# Patient Record
Sex: Female | Born: 1985 | Race: Black or African American | Hispanic: No | Marital: Single
Health system: Midwestern US, Community
[De-identification: ages and names within clinical notes are randomized; demographics above are authoritative.]

## PROBLEM LIST (undated history)

## (undated) DIAGNOSIS — Z6841 Body Mass Index (BMI) 40.0 and over, adult: Secondary | ICD-10-CM

---

## 2006-11-15 HISTORY — PX: BACK SURGERY: SHX140

## 2013-12-11 ENCOUNTER — Emergency Department (HOSPITAL_COMMUNITY)
Admission: EM | Admit: 2013-12-11 | Discharge: 2013-12-11 | Disposition: A | Discharge status: Home / Self Care | Payer: No Typology Code available for payment source | Attending: Emergency Medicine | Admitting: Emergency Medicine

## 2013-12-11 ENCOUNTER — Encounter (HOSPITAL_COMMUNITY): Payer: Self-pay | Admitting: Emergency Medicine

## 2013-12-11 ENCOUNTER — Emergency Department (HOSPITAL_COMMUNITY): Payer: No Typology Code available for payment source

## 2013-12-11 DIAGNOSIS — Y939 Activity, unspecified: Secondary | ICD-10-CM | POA: Insufficient documentation

## 2013-12-11 DIAGNOSIS — Y92410 Unspecified street and highway as the place of occurrence of the external cause: Secondary | ICD-10-CM | POA: Insufficient documentation

## 2013-12-11 DIAGNOSIS — IMO0002 Reserved for concepts with insufficient information to code with codable children: Secondary | ICD-10-CM | POA: Insufficient documentation

## 2013-12-11 DIAGNOSIS — M545 Low back pain, unspecified: Secondary | ICD-10-CM

## 2013-12-11 DIAGNOSIS — M549 Dorsalgia, unspecified: Secondary | ICD-10-CM

## 2013-12-11 DIAGNOSIS — Z791 Long term (current) use of non-steroidal anti-inflammatories (NSAID): Secondary | ICD-10-CM | POA: Insufficient documentation

## 2013-12-11 LAB — URINALYSIS, ROUTINE W REFLEX MICROSCOPIC
Bilirubin Urine: NEGATIVE
Glucose, UA: NEGATIVE mg/dL
HGB URINE DIPSTICK: NEGATIVE
Ketones, ur: NEGATIVE mg/dL
Leukocytes, UA: NEGATIVE
Nitrite: NEGATIVE
PROTEIN: NEGATIVE mg/dL
Specific Gravity, Urine: 1.023 (ref 1.005–1.030)
UROBILINOGEN UA: 0.2 mg/dL (ref 0.0–1.0)
pH: 7.5 (ref 5.0–8.0)

## 2013-12-11 MED ORDER — HYDROCODONE-ACETAMINOPHEN 5-325 MG PO TABS: 1.0000 | ORAL_TABLET | ORAL | Status: AC | PRN

## 2013-12-11 MED ORDER — IBUPROFEN 400 MG PO TABS
800.0000 mg | ORAL_TABLET | Freq: Once | ORAL | Status: AC
  Administered 2013-12-11: 800 mg via ORAL
  Filled 2013-12-11: qty 2

## 2013-12-11 MED ORDER — KETOROLAC TROMETHAMINE 30 MG/ML IJ SOLN
30.0000 mg | Freq: Once | INTRAMUSCULAR | Status: AC
  Administered 2013-12-11: 30 mg via INTRAMUSCULAR

## 2013-12-11 MED ORDER — KETOROLAC TROMETHAMINE 30 MG/ML IJ SOLN
30.0000 mg | Freq: Once | INTRAMUSCULAR | Status: DC
  Filled 2013-12-11: qty 1

## 2013-12-11 MED ORDER — CYCLOBENZAPRINE HCL 10 MG PO TABS: 10.0000 mg | ORAL_TABLET | Freq: Every day | ORAL | Status: AC

## 2013-12-11 NOTE — Discharge Instructions (Signed)
Call for a follow up appointment with a Family or Primary Care Provider.  Call Dr. Su LeyKoen for further evaluation of your back pain. Return if Symptoms worsen.   Take medication as prescribed.
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
## 2013-12-14 NOTE — ED Provider Notes (Signed)
Medical screening examination/treatment/procedure(s) were performed by non-physician practitioner and as supervising physician I was immediately available for consultation/collaboration.  Flint MelterElliott L Fabian Walder, MD 12/14/13 [id number]
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
## 2019-07-14 ENCOUNTER — Inpatient Hospital Stay
Admit: 2019-07-14 | Discharge: 2019-07-14 | Disposition: A | Discharge status: Home / Self Care | Payer: PRIVATE HEALTH INSURANCE | Attending: Emergency Medicine

## 2019-07-14 DIAGNOSIS — J029 Acute pharyngitis, unspecified: Secondary | ICD-10-CM

## 2019-07-14 MED ORDER — CLINDAMYCIN 150 MG CAP
150 mg | ORAL_CAPSULE | Freq: Four times a day (QID) | ORAL | 0 refills | Status: DC
Start: 2019-07-14 — End: 2019-07-23

## 2019-07-14 MED ORDER — FLUCONAZOLE 150 MG TAB
150 mg | ORAL_TABLET | Freq: Every day | ORAL | 0 refills | Status: AC
Start: 2019-07-14 — End: 2019-07-15

## 2019-07-14 NOTE — ED Provider Notes (Signed)
33 year old female notes day 4 of sore throat, mild congestion, and left earache.  Patient seen in urgent care on day 1 of symptoms and had negative strep screen performed.  Also underwent COVID???19 testing with result pending.  Patient denies cough, fever, or shortness of breath.  Notes ongoing and worsening pain in the left side of the throat extending into the left ear and left side of the neck.  Pain on swallowing.           History reviewed. No pertinent past medical history.    Past Surgical History:   Procedure Laterality Date   ??? HX BACK SURGERY     ??? HX CESAREAN SECTION           History reviewed. No pertinent family history.    Social History     Socioeconomic History   ??? Marital status: SINGLE     Spouse name: Not on file   ??? Number of children: Not on file   ??? Years of education: Not on file   ??? Highest education level: Not on file   Occupational History   ??? Not on file   Social Needs   ??? Financial resource strain: Not on file   ??? Food insecurity     Worry: Not on file     Inability: Not on file   ??? Transportation needs     Medical: Not on file     Non-medical: Not on file   Tobacco Use   ??? Smoking status: Not on file   Substance and Sexual Activity   ??? Alcohol use: Not on file   ??? Drug use: Not on file   ??? Sexual activity: Not on file   Lifestyle   ??? Physical activity     Days per week: Not on file     Minutes per session: Not on file   ??? Stress: Not on file   Relationships   ??? Social Product manager on phone: Not on file     Gets together: Not on file     Attends religious service: Not on file     Active member of club or organization: Not on file     Attends meetings of clubs or organizations: Not on file     Relationship status: Not on file   ??? Intimate partner violence     Fear of current or ex partner: Not on file     Emotionally abused: Not on file     Physically abused: Not on file     Forced sexual activity: Not on file   Other Topics Concern   ??? Not on file   Social History Narrative   ???  Not on file         ALLERGIES: Latex and Pcn [penicillins]    Review of Systems   Constitutional: Negative for fever.   HENT: Positive for ear pain and sore throat.    Eyes: Negative for discharge.   Respiratory: Negative for cough and shortness of breath.    All other systems reviewed and are negative.      Vitals:    07/14/19 0940   BP: 131/83   Pulse: 87   Resp: 16   Temp: 98.9 ??F (37.2 ??C)   SpO2: 100%   Weight: 112.5 kg (248 lb)   Height: 5' (1.524 m)            Physical Exam  Vitals signs and nursing note reviewed.  Constitutional:       General: She is not in acute distress.     Appearance: She is well-developed.   HENT:      Head: Normocephalic and atraumatic.      Right Ear: Tympanic membrane normal.      Left Ear: Tympanic membrane normal.      Mouth/Throat:      Comments: Small amount of exudate along the left peritonsillar region.  Uvula is symmetric and midline.  Bilateral posterior oropharyngeal erythema present.  Eyes:      General: No scleral icterus.  Neck:      Comments: Tender left submandibular node.  Cardiovascular:      Rate and Rhythm: Normal rate.   Pulmonary:      Effort: Pulmonary effort is normal. No respiratory distress.      Breath sounds: Normal breath sounds.   Lymphadenopathy:      Cervical: Cervical adenopathy present.   Skin:     General: Skin is warm and dry.   Neurological:      Mental Status: She is alert and oriented to person, place, and time.          MDM  Number of Diagnoses or Management Options  Earache on left:   Exudative pharyngitis:   Diagnosis management comments: Impression: Acute pharyngitis worse on the left.  No exam findings of peritonsillar abscess currently.  Patient reports negative strep test.  Has some scant exudate.  Based on age, mono unlikely as primary source.  Will provide coverage for strep in case of possible false negative test as well as other atypicals.  Patient reports penicillin allergy.  Placed on clindamycin.  Diflucan also provided as patient  has history of vaginal yeast infections and response to antibiotics.  Overall appearance is nontoxic.  She has normal oxygen saturation is afebrile here.         Procedures    Diagnosis:   1. Exudative pharyngitis    2. Earache on left      1) Salt water gargles  2) Rest  3) Plenty of fluids  4) Tylenol or Motrin for pain and fever  5) Clindamycin  6) Follow up with your PCP for recheck in 5-7 days if not improved.  7) Probiotics (over the counter) while taking Clindamycin  8) Diflucan x 1 dose should vaginal yeast infection occur    Disposition: home    Follow-up Information     Follow up With Specialties Details Why Contact Info    [facility]  Schedule an appointment as soon as possible for a visit in 1 week As needed, If symptoms persist [address]  [address]  [place] [id number]  [phone]    [facility] Emergency Medicine  If symptoms worsen [address]  [place] [phone]  [phone]          Patient's Medications   Start Taking    CLINDAMYCIN (CLEOCIN) 150 MG CAPSULE    Take 2 Caps by mouth four (4) times daily.    FLUCONAZOLE (DIFLUCAN) 150 MG TABLET    Take 1 Tab by mouth daily for 1 day.   Continue Taking    LEVONORGESTREL (MIRENA) 20 MCG/24 HOURS (5 YRS) 52 MG IUD    1 Device by IntraUTERine route once.   These Medications have changed    No medications on file   Stop Taking    No medications on file

## 2019-07-14 NOTE — ED Notes (Signed)
C/O sore throat and left ear pain since Wed. Pt states that she was seen at [facility] on Wed and had negative strep and covid test, which she has not received results for.

## 2019-07-14 NOTE — ED Triage Notes (Signed)
C/O sore throat and left ear pain since Wed. Pt states that she was seen at [facility] on Wed and had negative strep and covid test, which she has not received results for.

## 2019-07-14 NOTE — ED Provider Notes (Signed)
33 year old female notes day 4 of sore throat, mild congestion, and left earache.  Patient seen in urgent care on day 1 of symptoms and had negative strep screen performed.  Also underwent COVID???19 testing with result pending.  Patient denies cough, fever, or shortness of breath.  Notes ongoing and worsening pain in the left side of the throat extending into the left ear and left side of the neck.  Pain on swallowing.           History reviewed. No pertinent past medical history.    Past Surgical History:   Procedure Laterality Date   ??? HX BACK SURGERY     ??? HX CESAREAN SECTION           History reviewed. No pertinent family history.    Social History     Socioeconomic History   ??? Marital status: SINGLE     Spouse name: Not on file   ??? Number of children: Not on file   ??? Years of education: Not on file   ??? Highest education level: Not on file   Occupational History   ??? Not on file   Social Needs   ??? Financial resource strain: Not on file   ??? Food insecurity     Worry: Not on file     Inability: Not on file   ??? Transportation needs     Medical: Not on file     Non-medical: Not on file   Tobacco Use   ??? Smoking status: Not on file   Substance and Sexual Activity   ??? Alcohol use: Not on file   ??? Drug use: Not on file   ??? Sexual activity: Not on file   Lifestyle   ??? Physical activity     Days per week: Not on file     Minutes per session: Not on file   ??? Stress: Not on file   Relationships   ??? Social Wellsite geologistconnections     Talks on phone: Not on file     Gets together: Not on file     Attends religious service: Not on file     Active member of club or organization: Not on file     Attends meetings of clubs or organizations: Not on file     Relationship status: Not on file   ??? Intimate partner violence     Fear of current or ex partner: Not on file     Emotionally abused: Not on file     Physically abused: Not on file     Forced sexual activity: Not on file   Other Topics Concern   ??? Not on file   Social History Narrative    ??? Not on file         ALLERGIES: Latex and Pcn [penicillins]    Review of Systems   Constitutional: Negative for fever.   HENT: Positive for ear pain and sore throat.    Eyes: Negative for discharge.   Respiratory: Negative for cough and shortness of breath.    All other systems reviewed and are negative.      Vitals:    07/14/19 0940   BP: 131/83   Pulse: 87   Resp: 16   Temp: 98.9 ??F (37.2 ??C)   SpO2: 100%   Weight: 112.5 kg (248 lb)   Height: 5' (1.524 m)            Physical Exam  Vitals signs and nursing note reviewed.  Constitutional:       General: She is not in acute distress.     Appearance: She is well-developed.   HENT:      Head: Normocephalic and atraumatic.      Right Ear: Tympanic membrane normal.      Left Ear: Tympanic membrane normal.      Mouth/Throat:      Comments: Small amount of exudate along the left peritonsillar region.  Uvula is symmetric and midline.  Bilateral posterior oropharyngeal erythema present.  Eyes:      General: No scleral icterus.  Neck:      Comments: Tender left submandibular node.  Cardiovascular:      Rate and Rhythm: Normal rate.   Pulmonary:      Effort: Pulmonary effort is normal. No respiratory distress.      Breath sounds: Normal breath sounds.   Lymphadenopathy:      Cervical: Cervical adenopathy present.   Skin:     General: Skin is warm and dry.   Neurological:      Mental Status: She is alert and oriented to person, place, and time.          MDM  Number of Diagnoses or Management Options  Earache on left:   Exudative pharyngitis:   Diagnosis management comments: Impression: Acute pharyngitis worse on the left.  No exam findings of peritonsillar abscess currently.  Patient reports negative strep test.  Has some scant exudate.  Based on age, mono unlikely as primary source.  Will provide coverage for strep in case of possible false negative test as well as other atypicals.  Patient reports penicillin allergy.  Placed on clindamycin.  Diflucan also provided as  patient has history of vaginal yeast infections and response to antibiotics.  Overall appearance is nontoxic.  She has normal oxygen saturation is afebrile here.         Procedures    Diagnosis:   1. Exudative pharyngitis    2. Earache on left      1) Salt water gargles  2) Rest  3) Plenty of fluids  4) Tylenol or Motrin for pain and fever  5) Clindamycin  6) Follow up with your PCP for recheck in 5-7 days if not improved.  7) Probiotics (over the counter) while taking Clindamycin  8) Diflucan x 1 dose should vaginal yeast infection occur    Disposition: home    Follow-up Information     Follow up With Specialties Details Why Contact Info    [facility]  Schedule an appointment as soon as possible for a visit in 1 week As needed, If symptoms persist [address]  [place]    [facility] Emergency Medicine  If symptoms worsen [address]  [place] [address]  [phone]          Patient's Medications   Start Taking    CLINDAMYCIN (CLEOCIN) 150 MG CAPSULE    Take 2 Caps by mouth four (4) times daily.    FLUCONAZOLE (DIFLUCAN) 150 MG TABLET    Take 1 Tab by mouth daily for 1 day.   Continue Taking    LEVONORGESTREL (MIRENA) 20 MCG/24 HOURS (5 YRS) 52 MG IUD    1 Device by IntraUTERine route once.   These Medications have changed    No medications on file   Stop Taking    No medications on file

## 2019-07-16 NOTE — Progress Notes (Signed)
Date/Time:  07/16/2019 11:30 AM  Attempted to reach patient by telephone. Unable to leave HIPPA compliant message requesting a return call. Will attempt to reach patient again.

## 2019-07-16 NOTE — Progress Notes (Signed)
Date/Time:  07/16/2019 11:30 AM  Attempted to reach patient by telephone. Unable to leave HIPPA compliant message requesting a return call. Will attempt to reach patient again.

## 2019-07-17 NOTE — Progress Notes (Signed)
Date/Time:  07/17/2019 11:42 AM  2nd attempt to reach patient by telephone.Unable to leave HIPPA compliant message requesting a return call. This episode is resolved.

## 2019-07-17 NOTE — Progress Notes (Addendum)
Date/Time:  07/17/2019 11:42 AM  2nd attempt to reach patient by telephone.Unable to leave HIPPA compliant message requesting a return call. This episode is resolved.

## 2019-07-23 ENCOUNTER — Emergency Department: Admit: 2019-07-23 | Payer: PRIVATE HEALTH INSURANCE

## 2019-07-23 ENCOUNTER — Inpatient Hospital Stay
Admit: 2019-07-23 | Discharge: 2019-07-23 | Disposition: A | Discharge status: Home / Self Care | Payer: PRIVATE HEALTH INSURANCE | Attending: Emergency Medicine

## 2019-07-23 DIAGNOSIS — M25561 Pain in right knee: Secondary | ICD-10-CM

## 2019-07-23 MED ORDER — NAPROXEN 500 MG TAB
500 mg | ORAL_TABLET | Freq: Two times a day (BID) | ORAL | 0 refills | Status: AC
Start: 2019-07-23 — End: 2019-08-02

## 2019-07-23 MED ORDER — LIDOCAINE 5 % TOPICAL OINTMENT
5 % | CUTANEOUS | 0 refills | Status: DC | PRN
Start: 2019-07-23 — End: 2020-10-07

## 2019-07-23 MED ORDER — ACETAMINOPHEN 500 MG TAB
500 mg | ORAL_TABLET | Freq: Four times a day (QID) | ORAL | 0 refills | Status: DC | PRN
Start: 2019-07-23 — End: 2020-10-07

## 2019-07-23 MED ORDER — KETOROLAC TROMETHAMINE 15 MG/ML INJECTION
15 mg/mL | INTRAMUSCULAR | Status: AC
Start: 2019-07-23 — End: 2019-07-23
  Administered 2019-07-23: 17:00:00 via INTRAMUSCULAR

## 2019-07-23 MED FILL — KETOROLAC TROMETHAMINE 15 MG/ML INJECTION: 15 mg/mL | INTRAMUSCULAR | Qty: 1

## 2019-07-23 NOTE — ED Provider Notes (Signed)
 Formatting of this note is different from the original.  EMERGENCY DEPARTMENT HISTORY AND PHYSICAL EXAM    12:31 PM    Date: 07/23/2019  Patient Name: Monique Rogers    History of Presenting Illness     Chief Complaint   Patient presents with   ? Knee Pain     History Provided By: Patient    Additional History (Context): Monique Rogers is a 33 y.o. female with No significant past medical history who presents with complaint of right lateral knee pain x 4 days.  Patient notes pain is worse with movement, especially ambulation.  Denies injury or trauma, numbness or tingling, leg swelling or discoloration, calf tenderness.  Notes she has tried Motrin at home without relief.    PCP: None    Current Facility-Administered Medications   Medication Dose Route Frequency Provider Last Rate Last Dose   ? ketorolac (TORADOL) injection 15 mg  15 mg IntraMUSCular NOW Scissom, Danielle N, PA         Current Outpatient Medications   Medication Sig Dispense Refill   ? naproxen (Naprosyn) 500 mg tablet Take 1 Tab by mouth two (2) times daily (with meals) for 10 days. 20 Tab 0   ? acetaminophen  (Tylenol  Extra Strength) 500 mg tablet Take 2 Tabs by mouth every six (6) hours as needed for Pain. 20 Tab 0   ? lidocaine (XYLOCAINE) 5 % ointment Apply 1 Tube to affected area as needed for Pain. 1 Tube 0   ? levonorgestreL (Mirena) 20 mcg/24 hours (5 yrs) 52 mg IUD 1 Device by IntraUTERine route once.       Past History     Past Medical History:  History reviewed. No pertinent past medical history.    Past Surgical History:  Past Surgical History:   Procedure Laterality Date   ? HX BACK SURGERY     ? HX CESAREAN SECTION       Family History:  History reviewed. No pertinent family history.    Social History:  Social History     Tobacco Use   ? Smoking status: Not on file   Substance Use Topics   ? Alcohol use: Not on file   ? Drug use: Not on file     Allergies:  Allergies   Allergen Reactions   ? Latex Rash   ? Pcn [Penicillins] Rash     Review of  Systems     Review of Systems   Constitutional: Negative for chills and fever.   Respiratory: Negative for shortness of breath.    Cardiovascular: Negative for chest pain.   Gastrointestinal: Negative for abdominal pain, nausea and vomiting.   Musculoskeletal: Positive for myalgias.   Skin: Negative for rash.   Neurological: Negative for weakness.   All other systems reviewed and are negative.    Physical Exam     Visit Vitals  BP 120/85 (BP 1 Location: Left arm, BP Patient Position: At rest;Sitting)   Pulse (!) 112   Temp 98.5 F (36.9 C)   Resp 18   SpO2 97%     Physical Exam  Vitals signs and nursing note reviewed.   Constitutional:       General: She is not in acute distress.     Appearance: Normal appearance. She is well-developed. She is obese. She is not ill-appearing, toxic-appearing or diaphoretic.   HENT:      Head: Normocephalic and atraumatic.   Neck:      Musculoskeletal: Normal range of motion  and neck supple.   Cardiovascular:      Rate and Rhythm: Normal rate and regular rhythm.      Heart sounds: Normal heart sounds. No murmur. No friction rub. No gallop.    Pulmonary:      Effort: Pulmonary effort is normal. No respiratory distress.      Breath sounds: Normal breath sounds. No wheezing or rales.   Musculoskeletal: Normal range of motion.      Right knee: She exhibits normal range of motion, no swelling, no effusion, no ecchymosis, no deformity, no laceration, no erythema, normal alignment, no LCL laxity and no MCL laxity. Tenderness found. Lateral joint line tenderness noted. No medial joint line tenderness noted.      Right lower leg: Normal. She exhibits no tenderness and no swelling. No edema.      Comments: No erythema/edema/discoloration; dorsalis pedis 2+   Skin:     General: Skin is warm.      Findings: No rash.   Neurological:      Mental Status: She is alert.     Diagnostic Study Results     Labs -  No results found for this or any previous visit (from the past 12 hour(s)).    Radiologic  Studies -   XR KNEE RT 3 V    (Results Pending)   no acute process     Medical Decision Making   I am the first provider for this patient.    I reviewed the vital signs, available nursing notes, past medical history, past surgical history, family history and social history.    Vital Signs-Reviewed the patient's vital signs.  HR reduced to 102.     Records Reviewed: Nursing Notes and Old Medical Records (Time of Review: 12:31 PM)    ED Course: Progress Notes, Reevaluation, and Consults:  12:41 PM  Reviewed results with patient. Discussed need for close outpatient follow-up this week for reassessment. Discussed strict return precautions, including leg swelling, discoloration or any other medical concerns.     Provider Notes (Medical Decision Making): 33 year old female who presents to the ED due to right lateral knee pain x4 days.  Afebrile, nontoxic-appearing, looks well.  No erythema, edema, discoloration to leg.  Extremity neurovascularly intact.  X-ray without acute process.  Do not feel labs or further imaging is warranted.  Stable for discharge with symptomatic management and close outpatient follow-up for further assessment.    Diagnosis     Clinical Impression:   1. Acute pain of right knee      Disposition: home     Follow-up Information     Follow up With Specialties Details Why Contact Info    [facility] Emergency Medicine  If symptoms worsen [address] [place]  [address]  [phone]    [facility] Schedule an appointment as soon as possible for a visit  [address] [place], [address]  [place]   [address]  [phone]         Patient's Medications   Start Taking    ACETAMINOPHEN  (TYLENOL  EXTRA STRENGTH) 500 MG TABLET    Take 2 Tabs by mouth every six (6) hours as needed for Pain.    LIDOCAINE (XYLOCAINE) 5 % OINTMENT    Apply 1 Tube to affected area as needed for Pain.    NAPROXEN (NAPROSYN) 500 MG TABLET     Take 1 Tab by mouth two (2) times daily (with meals) for  10 days.   Continue Taking    LEVONORGESTREL (MIRENA) 20 MCG/24 HOURS (5 YRS) 52 MG IUD    1 Device by IntraUTERine route once.   These Medications have changed    No medications on file   Stop Taking    CLINDAMYCIN (CLEOCIN) 150 MG CAPSULE    Take 2 Caps by mouth four (4) times daily.     Dictation disclaimer:  Please note that this dictation was completed with Dragon, the computer voice recognition software.  Quite often unanticipated grammatical, syntax, homophones, and other interpretive errors are inadvertently transcribed by the computer software.  Please disregard these errors.  Please excuse any errors that have escaped final proofreading.     Electronically signed by Dietra Lupita FALCON, MD at 07/23/2019  1:04 PM EDT    Associated attestation - Dietra Lupita FALCON, MD - 07/23/2019  1:04 PM EDT  Formatting of this note might be different from the original.  I was personally available for consultation in the emergency department. I have reviewed the chart prior to the patient's discharge and agree with the documentation recorded by the [facility], including the assessment, treatment plan, and disposition.

## 2019-07-23 NOTE — ED Triage Notes (Signed)
 Formatting of this note might be different from the original.  Patient c/o right knee pain x 4 days.  She denies any specific injury but states she was on her knees cleaning the floor before the pain started.  She states she has not been able to put weight on her right leg.    Electronically signed by Juvenal Moats, RN at 07/23/2019 11:44 AM EDT

## 2019-07-23 NOTE — ED Provider Notes (Signed)
ED Provider Notes by Silvio Pate, PA at 07/23/19 1231                Author: Silvio Pate, PA  Service: EMERGENCY  Author Type: Physician Assistant       Filed: 07/23/19 1303  Date of Service: 07/23/19 1231  Status: Attested Addendum          Editor: Silvio Pate, PA (Physician Assistant)       Related Notes: Original Note by Silvio Pate, PA (Physician Assistant) filed at 07/23/19  1302          Cosigner: Reuel Derby, MD at 07/23/19 1304          Attestation signed by Reuel Derby, MD at 07/23/19 1304          I was personally available for consultation in the emergency department. I have reviewed the chart prior to the patient's discharge and agree with  the documentation recorded by the [facility], including the assessment, treatment plan, and disposition.                                    EMERGENCY DEPARTMENT HISTORY AND PHYSICAL EXAM      12:31 PM         Date: 07/23/2019   Patient Name: Monique Rogers        History of Presenting Illness          Chief Complaint       Patient presents with        ?  Knee Pain              History Provided By: Patient      Additional History (Context): Monique Rogers  is a 33 y.o. female with  No significant past medical history who presents with complaint of right lateral knee pain x 4 days.  Patient notes pain is worse with movement, especially ambulation.  Denies injury or trauma, numbness  or tingling, leg swelling or discoloration, calf tenderness.  Notes she has tried Motrin at home without relief.      PCP: None        Current Facility-Administered Medications             Medication  Dose  Route  Frequency  Provider  Last Rate  Last Dose              ?  ketorolac (TORADOL) injection 15 mg   15 mg  IntraMUSCular  NOW  Scissom, Ventura Leggitt N, PA                Current Outpatient Medications          Medication  Sig  Dispense  Refill           ?  naproxen (Naprosyn) 500 mg tablet  Take 1 Tab by mouth two (2) times daily (with meals) for 10 days.  20 Tab  0      ?  acetaminophen (Tylenol Extra Strength) 500 mg tablet  Take 2 Tabs by mouth every six (6) hours as needed for Pain.  20 Tab  0           ?  lidocaine (XYLOCAINE) 5 % ointment  Apply 1 Tube to affected area as needed for Pain.  1 Tube  0           ?  levonorgestreL (Mirena) 20 mcg/24 hours (5 yrs)  52 mg IUD  1 Device by IntraUTERine route once.                 Past History        Past Medical History:   History reviewed. No pertinent past medical history.      Past Surgical History:     Past Surgical History:         Procedure  Laterality  Date          ?  HX BACK SURGERY              ?  HX CESAREAN SECTION               Family History:   History reviewed. No pertinent family history.      Social History:     Social History          Tobacco Use         ?  Smoking status:  Not on file       Substance Use Topics         ?  Alcohol use:  Not on file         ?  Drug use:  Not on file           Allergies:     Allergies        Allergen  Reactions         ?  Latex  Rash         ?  Pcn [Penicillins]  Rash                Review of Systems           Review of Systems    Constitutional: Negative for chills and fever.    Respiratory: Negative for shortness of breath.     Cardiovascular: Negative for chest pain.    Gastrointestinal: Negative for abdominal pain, nausea and vomiting.    Musculoskeletal: Positive for myalgias.    Skin: Negative for rash.    Neurological: Negative for weakness.    All other systems reviewed and are negative.              Physical Exam        Visit Vitals      BP  120/85 (BP 1 Location: Left arm, BP Patient Position: At rest;Sitting)     Pulse  (!) 112     Temp  98.5 ??F (36.9 ??C)     Resp  18        SpO2  97%              Physical Exam   Vitals signs and nursing note reviewed.   Constitutional:        General: She is not in acute distress.     Appearance: Normal appearance. She is well-developed. She is obese. She is not ill-appearing,  toxic-appearing or diaphoretic.   HENT :       Head:  Normocephalic and atraumatic.   Neck:       Musculoskeletal: Normal range of motion and neck supple.   Cardiovascular :       Rate and Rhythm: Normal rate and regular rhythm.      Heart sounds: Normal heart sounds. No murmur. No friction rub. No gallop.     Pulmonary:       Effort: Pulmonary effort is normal. No respiratory distress.      Breath sounds: Normal breath sounds. No  wheezing or rales.    Musculoskeletal: Normal range of motion.      Right knee: She exhibits normal range of motion, no swelling, no effusion, no ecchymosis, no deformity, no laceration, no erythema, normal alignment,  no LCL laxity and no MCL laxity. Tenderness found. Lateral joint line  tenderness noted. No medial joint line tenderness noted.      Right lower leg: Normal. She exhibits no tenderness and no swelling. No edema.      Comments: No erythema/edema/discoloration;  dorsalis pedis 2+    Skin:      General: Skin is warm.      Findings: No rash.   Neurological :       Mental Status: She is alert.                 Diagnostic Study Results        Labs -   No results found for this or any previous visit (from the past 12 hour(s)).      Radiologic Studies -      XR KNEE RT 3 V    (Results Pending)     no acute process            Medical Decision Making     I am the first provider for this patient.      I reviewed the vital signs, available nursing notes, past medical history, past surgical history, family history and social history.      Vital Signs-Reviewed the patient's vital signs.   HR reduced to 102.       Records Reviewed: Nursing Notes and Old Medical Records (Time of Review: 12:31 PM)      ED Course: Progress Notes, Reevaluation, and Consults:   12:41 PM  Reviewed results with patient. Discussed need for close outpatient follow-up this week for reassessment. Discussed strict return precautions, including leg swelling, discoloration or any other  medical concerns.       Provider Notes (Medical Decision Making): 33 year old female who  presents to the ED due to right lateral knee pain x4 days.  Afebrile, nontoxic-appearing, looks well.  No erythema, edema, discoloration  to leg.  Extremity neurovascularly intact.  X-ray without acute process.  Do not feel labs or further imaging is warranted.  Stable for discharge with symptomatic management and close outpatient follow-up for further assessment.           Diagnosis        Clinical Impression:       1.  Acute pain of right knee            Disposition: home         Follow-up Information               Follow up With  Specialties  Details  Why  Contact Info              [facility]  Emergency Medicine    If symptoms worsen  [address]   [place] [id number]   [phone]              [facility]  Schedule an appointment as soon as possible for a visit    [address]   [place] [id number]   [phone]                   Patient's Medications  Start Taking           ACETAMINOPHEN (TYLENOL EXTRA STRENGTH) 500 MG TABLET     Take 2 Tabs by mouth every six (6) hours as needed for Pain.           LIDOCAINE (XYLOCAINE) 5 % OINTMENT     Apply 1 Tube to affected area as needed for Pain.           NAPROXEN (NAPROSYN) 500 MG TABLET     Take 1 Tab by mouth two (2) times daily (with meals) for 10 days.       Continue Taking           LEVONORGESTREL (MIRENA) 20 MCG/24 HOURS (5 YRS) 52 MG IUD     1 Device by IntraUTERine route once.       These Medications have changed          No medications on file       Stop Taking           CLINDAMYCIN (CLEOCIN) 150 MG CAPSULE     Take 2 Caps by mouth four (4) times daily.           Dictation disclaimer:  Please note that this dictation was completed with Dragon, the computer voice recognition software.  Quite often unanticipated grammatical, syntax, homophones, and other  interpretive errors are inadvertently transcribed by the computer software.  Please  disregard these errors.  Please excuse any errors that have escaped final proofreading.

## 2019-07-23 NOTE — ED Notes (Signed)
Patient c/o right knee pain x 4 days.  She denies any specific injury but states she was on her knees cleaning the floor before the pain started.  She states she has not been able to put weight on her right leg.

## 2019-07-23 NOTE — ED Provider Notes (Addendum)
EMERGENCY DEPARTMENT HISTORY AND PHYSICAL EXAM    12:31 PM      Date: 07/23/2019  Patient Name: Monique Rogers    History of Presenting Illness     Chief Complaint   Patient presents with   ??? Knee Pain         History Provided By: Patient    Additional History (Context): Monique Rogers is a 33 y.o. female with No significant past medical history who presents with complaint of right lateral knee pain x 4 days.  Patient notes pain is worse with movement, especially ambulation.  Denies injury or trauma, numbness or tingling, leg swelling or discoloration, calf tenderness.  Notes she has tried Motrin at home without relief.    PCP: None    Current Facility-Administered Medications   Medication Dose Route Frequency Provider Last Rate Last Dose   ??? ketorolac (TORADOL) injection 15 mg  15 mg IntraMUSCular NOW Scissom, Avis Tirone N, PA         Current Outpatient Medications   Medication Sig Dispense Refill   ??? naproxen (Naprosyn) 500 mg tablet Take 1 Tab by mouth two (2) times daily (with meals) for 10 days. 20 Tab 0   ??? acetaminophen (Tylenol Extra Strength) 500 mg tablet Take 2 Tabs by mouth every six (6) hours as needed for Pain. 20 Tab 0   ??? lidocaine (XYLOCAINE) 5 % ointment Apply 1 Tube to affected area as needed for Pain. 1 Tube 0   ??? levonorgestreL (Mirena) 20 mcg/24 hours (5 yrs) 52 mg IUD 1 Device by IntraUTERine route once.         Past History     Past Medical History:  History reviewed. No pertinent past medical history.    Past Surgical History:  Past Surgical History:   Procedure Laterality Date   ??? HX BACK SURGERY     ??? HX CESAREAN SECTION         Family History:  History reviewed. No pertinent family history.    Social History:  Social History     Tobacco Use   ??? Smoking status: Not on file   Substance Use Topics   ??? Alcohol use: Not on file   ??? Drug use: Not on file       Allergies:  Allergies   Allergen Reactions   ??? Latex Rash   ??? Pcn [Penicillins] Rash         Review of Systems       Review of Systems    Constitutional: Negative for chills and fever.   Respiratory: Negative for shortness of breath.    Cardiovascular: Negative for chest pain.   Gastrointestinal: Negative for abdominal pain, nausea and vomiting.   Musculoskeletal: Positive for myalgias.   Skin: Negative for rash.   Neurological: Negative for weakness.   All other systems reviewed and are negative.        Physical Exam     Visit Vitals  BP 120/85 (BP 1 Location: Left arm, BP Patient Position: At rest;Sitting)   Pulse (!) 112   Temp 98.5 ??F (36.9 ??C)   Resp 18   SpO2 97%         Physical Exam  Vitals signs and nursing note reviewed.   Constitutional:       General: She is not in acute distress.     Appearance: Normal appearance. She is well-developed. She is obese. She is not ill-appearing, toxic-appearing or diaphoretic.   HENT:      Head:  Normocephalic and atraumatic.   Neck:      Musculoskeletal: Normal range of motion and neck supple.   Cardiovascular:      Rate and Rhythm: Normal rate and regular rhythm.      Heart sounds: Normal heart sounds. No murmur. No friction rub. No gallop.    Pulmonary:      Effort: Pulmonary effort is normal. No respiratory distress.      Breath sounds: Normal breath sounds. No wheezing or rales.   Musculoskeletal: Normal range of motion.      Right knee: She exhibits normal range of motion, no swelling, no effusion, no ecchymosis, no deformity, no laceration, no erythema, normal alignment, no LCL laxity and no MCL laxity. Tenderness found. Lateral joint line tenderness noted. No medial joint line tenderness noted.      Right lower leg: Normal. She exhibits no tenderness and no swelling. No edema.      Comments: No erythema/edema/discoloration; dorsalis pedis 2+   Skin:     General: Skin is warm.      Findings: No rash.   Neurological:      Mental Status: She is alert.           Diagnostic Study Results     Labs -  No results found for this or any previous visit (from the past 12 hour(s)).    Radiologic Studies -    XR KNEE RT 3 V    (Results Pending)   no acute process       Medical Decision Making   I am the first provider for this patient.    I reviewed the vital signs, available nursing notes, past medical history, past surgical history, family history and social history.    Vital Signs-Reviewed the patient's vital signs.  HR reduced to 102.     Records Reviewed: Nursing Notes and Old Medical Records (Time of Review: 12:31 PM)    ED Course: Progress Notes, Reevaluation, and Consults:  12:41 PM  Reviewed results with patient. Discussed need for close outpatient follow-up this week for reassessment. Discussed strict return precautions, including leg swelling, discoloration or any other medical concerns.     Provider Notes (Medical Decision Making): 33 year old female who presents to the ED due to right lateral knee pain x4 days.  Afebrile, nontoxic-appearing, looks well.  No erythema, edema, discoloration to leg.  Extremity neurovascularly intact.  X-ray without acute process.  Do not feel labs or further imaging is warranted.  Stable for discharge with symptomatic management and close outpatient follow-up for further assessment.      Diagnosis     Clinical Impression:   1. Acute pain of right knee        Disposition: home     Follow-up Information     Follow up With Specialties Details Why [place] EMERGENCY DEPT Emergency Medicine  If symptoms worsen [place] [address]  [facility] Schedule an appointment as soon as possible for a visit  [place], [place]  [phone]           Patient's Medications   Start Taking    ACETAMINOPHEN (TYLENOL EXTRA STRENGTH) 500 MG TABLET    Take 2 Tabs by mouth every six (6) hours as needed for Pain.    LIDOCAINE (XYLOCAINE) 5 % OINTMENT    Apply 1 Tube to  affected area as needed for Pain.     NAPROXEN (NAPROSYN) 500 MG TABLET    Take 1 Tab by mouth two (2) times daily (with meals) for 10 days.   Continue Taking    LEVONORGESTREL (MIRENA) 20 MCG/24 HOURS (5 YRS) 52 MG IUD    1 Device by IntraUTERine route once.   These Medications have changed    No medications on file   Stop Taking    CLINDAMYCIN (CLEOCIN) 150 MG CAPSULE    Take 2 Caps by mouth four (4) times daily.       Dictation disclaimer:  Please note that this dictation was completed with Dragon, the computer voice recognition software.  Quite often unanticipated grammatical, syntax, homophones, and other interpretive errors are inadvertently transcribed by the computer software.  Please disregard these errors.  Please excuse any errors that have escaped final proofreading.

## 2019-07-23 NOTE — ED Triage Notes (Signed)
Patient c/o right knee pain x 4 days.  She denies any specific injury but states she was on her knees cleaning the floor before the pain started.  She states she has not been able to put weight on her right leg.

## 2020-06-20 ENCOUNTER — Inpatient Hospital Stay
Admit: 2020-06-20 | Discharge: 2020-06-20 | Disposition: A | Discharge status: Home / Self Care | Payer: PRIVATE HEALTH INSURANCE | Attending: Emergency Medicine

## 2020-06-20 DIAGNOSIS — S46811A Strain of other muscles, fascia and tendons at shoulder and upper arm level, right arm, initial encounter: Secondary | ICD-10-CM

## 2020-06-20 MED ORDER — CYCLOBENZAPRINE 10 MG TAB
10 mg | ORAL_TABLET | Freq: Three times a day (TID) | ORAL | 0 refills | Status: DC | PRN
Start: 2020-06-20 — End: 2020-10-07

## 2020-06-20 NOTE — ED Notes (Signed)
Pt reports getting vaccinations on 7/26 states started getting pain in right arm last night and now woke up this morning with inability to move neck. Pt reports taking ibuprofen for the muscle aches, 800 mg.

## 2020-06-20 NOTE — ED Provider Notes (Signed)
34 year old female obtained COVID-19 vaccine about a week ago.  Administered to the upper right deltoid region.  Patient has developed local redness and swelling that area over the last 24 hours.  Upon awakening today patient noted the pain had a scented into the trapezius region of the neck.  She now has stiffness and soreness when she attempts to use the trapezius muscles.  No fever or other generalized reaction.           History reviewed. No pertinent past medical history.    Past Surgical History:   Procedure Laterality Date   ??? HX BACK SURGERY     ??? HX CESAREAN SECTION           History reviewed. No pertinent family history.    Social History     Socioeconomic History   ??? Marital status: SINGLE     Spouse name: Not on file   ??? Number of children: Not on file   ??? Years of education: Not on file   ??? Highest education level: Not on file   Occupational History   ??? Not on file   Tobacco Use   ??? Smoking status: Not on file   Substance and Sexual Activity   ??? Alcohol use: Not on file   ??? Drug use: Not on file   ??? Sexual activity: Not on file   Other Topics Concern   ??? Not on file   Social History Narrative   ??? Not on file     Social Determinants of Health     Financial Resource Strain:    ??? Difficulty of Paying Living Expenses:    Food Insecurity:    ??? Worried About Programme researcher, broadcasting/film/video in the Last Year:    ??? Barista in the Last Year:    Transportation Needs:    ??? Freight forwarder (Medical):    ??? Lack of Transportation (Non-Medical):    Physical Activity:    ??? Days of Exercise per Week:    ??? Minutes of Exercise per Session:    Stress:    ??? Feeling of Stress :    Social Connections:    ??? Frequency of Communication with Friends and Family:    ??? Frequency of Social Gatherings with Friends and Family:    ??? Attends Religious Services:    ??? Database administrator or Organizations:    ??? Attends Engineer, structural:    ??? Marital Status:    Intimate Programme researcher, broadcasting/film/video Violence:    ??? Fear of Current or Ex-Partner:     ??? Emotionally Abused:    ??? Physically Abused:    ??? Sexually Abused:          ALLERGIES: Latex and Pcn [penicillins]    Review of Systems   Constitutional: Negative for fever.   Skin: Positive for color change. Negative for wound.   All other systems reviewed and are negative.      Vitals:    06/20/20 0958   BP: 125/74   Pulse: 70   Resp: 18   Temp: 98.8 ??F (37.1 ??C)   SpO2: 95%   Weight: 103.4 kg (228 lb)   Height: 5' (1.524 m)            Physical Exam  Vitals and nursing note reviewed.   Constitutional:       General: She is not in acute distress.     Appearance: She is well-developed.   HENT:  Head: Normocephalic and atraumatic.   Eyes:      General: No scleral icterus.  Cardiovascular:      Rate and Rhythm: Normal rate.   Pulmonary:      Effort: Pulmonary effort is normal.   Musculoskeletal:      Comments: 3 x 3 area of tenderness with mild erythema noted upper right deltoid.  No erythema extending proximally.  Locally tender to palpation on the trapezius ridge on the right.  No midline tenderness over the cervical spine.  Distal neurovascular is preserved in the right upper extremity.   Skin:     General: Skin is warm and dry.   Neurological:      Mental Status: She is alert and oriented to person, place, and time.          MDM  Number of Diagnoses or Management Options  Adverse effect of vaccine, initial encounter  Strain of right trapezius muscle, initial encounter  Diagnosis management comments: Impression: Delayed inflammatory reaction to vaccine is common with Covid immunization.  Appears to have local inflammatory reaction.  Subsequently has developed some spasm along the affected side in the trapezius ridge.  Continue ibuprofen, add Flexeril and cold compresses.         Procedures    Diagnosis:   1. Strain of right trapezius muscle, initial encounter    2. Adverse effect of vaccine, initial encounter          Disposition: home    Follow-up Information     Follow up With Specialties Details Why  Contact Info    [facility]  Schedule an appointment as soon as possible for a visit in 1 week As needed, If symptoms worsen [address]  [address]  [place] [address]  [phone]          Patient's Medications   Start Taking    CYCLOBENZAPRINE (FLEXERIL) 10 MG TABLET    Take 1 Tablet by mouth three (3) times daily as needed for Muscle Spasm(s).   Continue Taking    ACETAMINOPHEN (TYLENOL EXTRA STRENGTH) 500 MG TABLET    Take 2 Tabs by mouth every six (6) hours as needed for Pain.    LEVONORGESTREL (MIRENA) 20 MCG/24 HOURS (5 YRS) 52 MG IUD    1 Device by IntraUTERine route once.    LIDOCAINE (XYLOCAINE) 5 % OINTMENT    Apply 1 Tube to affected area as needed for Pain.   These Medications have changed    No medications on file   Stop Taking    No medications on file

## 2020-06-20 NOTE — ED Notes (Signed)
I have reviewed discharge instruction and prescriptions with pt.  Pt verbalized understanding and has no further questions at this time.  Education taught and patient verbalized understanding of education.  Teach back method used.    Armband removed and shredded per patients request.    Patients pain 0/10.  Belongings are with pt.  Patient discharged with self to home.

## 2020-10-07 ENCOUNTER — Inpatient Hospital Stay
Admit: 2020-10-07 | Discharge: 2020-10-08 | Disposition: A | Discharge status: Home / Self Care | Payer: PRIVATE HEALTH INSURANCE | Attending: Emergency Medicine

## 2020-10-07 DIAGNOSIS — N644 Mastodynia: Secondary | ICD-10-CM

## 2020-10-07 NOTE — ED Provider Notes (Signed)
 Formatting of this note is different from the original.  EMERGENCY DEPARTMENT HISTORY AND PHYSICAL EXAM    I have evaluated the patient at 7:58 PM    Date: 10/07/2020  Patient Name: Monique Rogers    History of Presenting Illness     Chief Complaint   Patient presents with   ? Breast Problem   ? Abscess     History Provided By: Patient  Location/Duration/Severity/Modifying factors   34 year old female with history of reduction mammoplasty presenting to the emergency department with complaint of left breast pain and irritation.  Patient reports has been ongoing the past 2 days.  She reports some tenderness and erythema of the skin at the 1 o'clock position of her medial breast.  Patient reports that she has had this previously in the past and was given Keflex with improvement.  However, feels like it flares up and from time to time.  She has tried a warm compress on it at home.  She was advised by one of her friends to come to the emergency department for possible drainage of an abscess.  She has not noted any drainage from the area.    PCP: None    Current Facility-Administered Medications   Medication Dose Route Frequency Provider Last Rate Last Admin   ? cephALEXin (KEFLEX) capsule 500 mg  500 mg Oral NOW Sherril Barter, DO         Current Outpatient Medications   Medication Sig Dispense Refill   ? cephALEXin (Keflex) 500 mg capsule Take 1 Capsule by mouth four (4) times daily for 7 days. 28 Capsule 0   ? levonorgestreL (Mirena) 20 mcg/24 hours (5 yrs) 52 mg IUD 1 Device by IntraUTERine route once.       Past History     Past Medical History:  Past Medical History:   Diagnosis Date   ? Skin abscess 06/2020     Past Surgical History:  Past Surgical History:   Procedure Laterality Date   ? HX BACK SURGERY     ? HX BREAST REDUCTION  2017   ? HX CESAREAN SECTION       Family History:  History reviewed. No pertinent family history.    Social History:  Social History     Tobacco Use   ? Smoking status: Never Smoker   ?  Smokeless tobacco: Never Used   Substance Use Topics   ? Alcohol use: Never   ? Drug use: Never     Allergies:  Allergies   Allergen Reactions   ? Latex Rash   ? Pcn [Penicillins] Rash     Review of Systems     Review of Systems   Constitutional: Negative for activity change, chills, diaphoresis, fatigue and fever.   Eyes: Negative for photophobia, pain and visual disturbance.   Respiratory: Negative for cough, chest tightness, shortness of breath, wheezing and stridor.    Cardiovascular: Negative for chest pain and palpitations.   Gastrointestinal: Negative for abdominal distention, abdominal pain, constipation, diarrhea, nausea and vomiting.   Genitourinary: Negative for difficulty urinating, dysuria and hematuria.   Musculoskeletal: Negative for back pain, joint swelling and myalgias.   Skin: Negative for rash and wound.        Area of erythema, tenderness and induration on right breast   Neurological: Negative for dizziness, weakness and headaches.   Psychiatric/Behavioral: Negative for agitation. The patient is not nervous/anxious.      Physical Exam     Visit Vitals  BP 125/71 (  BP 1 Location: Left upper arm, BP Patient Position: At rest)   Pulse 72   Temp 98.5 F (36.9 C)   Resp 18   Ht 5' (1.524 m)   Wt 103.4 kg (228 lb)   LMP 09/18/2020   SpO2 100%   BMI 44.53 kg/m     Physical Exam  Exam conducted with a chaperone present (chaperoned by Rocky Pitt, RN).   Constitutional:       General: She is not in acute distress.     Appearance: She is not toxic-appearing.   HENT:      Head: Normocephalic and atraumatic.      Mouth/Throat:      Mouth: Mucous membranes are moist.   Eyes:      Extraocular Movements: Extraocular movements intact.      Pupils: Pupils are equal, round, and reactive to light.   Cardiovascular:      Rate and Rhythm: Normal rate and regular rhythm.      Heart sounds: Normal heart sounds. No murmur heard.  No friction rub. No gallop.    Pulmonary:      Effort: Pulmonary effort is normal.       Breath sounds: Normal breath sounds.   Abdominal:      General: There is no distension.      Palpations: Abdomen is soft. There is no mass.      Tenderness: There is no abdominal tenderness. There is no guarding.      Hernia: No hernia is present.   Musculoskeletal:         General: No swelling, tenderness or deformity.      Cervical back: Normal range of motion and neck supple.   Skin:     General: Skin is warm and dry.      Capillary Refill: Capillary refill takes less than 2 seconds.      Findings: Erythema present. No rash.      Comments: 1x3 cm area of induration, erythema and tenderness. No underlying fluctuance appreciated.    Neurological:      General: No focal deficit present.      Mental Status: She is alert and oriented to person, place, and time.   Psychiatric:         Mood and Affect: Mood normal.     Diagnostic Study Results     Labs -  No results found for this or any previous visit (from the past 12 hour(s)).    Radiologic Studies -   No orders to display     Medical Decision Making   I am the first provider for this patient.    I reviewed the vital signs, available nursing notes, past medical history, past surgical history, family history and social history.    Vital Signs-Reviewed the patient's vital signs.    Records Reviewed: Nursing Notes (Time of Review: 7:58 PM)    ED Course: Progress Notes, Reevaluation, and Consults:        Provider Notes (Medical Decision Making):   MDM  Number of Diagnoses or Management Options  Acute pain of breast  Diagnosis management comments: 34 year old female presenting to the emergency department for evaluation of what she believes is a breast abscess on her right breast.  There is an indurated area that is tender to palpation and there is no appreciable fluctuance.  Bedside ultrasound shows no pocket large enough to drain via I&D. Appears more keloidal in nature rather than cellulitic. However,  will treat patient with Keflex  and discharged with prescription for  this.  She has also been instructed on warm compresses as well as changing out her bra so that she is not achieving her bleeding irritation in that area.  ED return precautions have been discussed.  Patient verbalizes good understanding agreement plan.    Diagnosis     Clinical Impression:   1. Acute pain of breast      Disposition: home    Follow-up Information     Follow up With Specialties Details Why Contact Info    [facility] Emergency Medicine  As needed, If symptoms worsen [address] [place]  [address]  [phone]         Patient's Medications   Start Taking    CEPHALEXIN (KEFLEX) 500 MG CAPSULE    Take 1 Capsule by mouth four (4) times daily for 7 days.   Continue Taking    LEVONORGESTREL (MIRENA) 20 MCG/24 HOURS (5 YRS) 52 MG IUD    1 Device by IntraUTERine route once.   These Medications have changed    No medications on file   Stop Taking    ACETAMINOPHEN  (TYLENOL  EXTRA STRENGTH) 500 MG TABLET    Take 2 Tabs by mouth every six (6) hours as needed for Pain.    CYCLOBENZAPRINE (FLEXERIL) 10 MG TABLET    Take 1 Tablet by mouth three (3) times daily as needed for Muscle Spasm(s).    LIDOCAINE (XYLOCAINE) 5 % OINTMENT    Apply 1 Tube to affected area as needed for Pain.     Disclaimer: Sections of this note are dictated using utilizing voice recognition software.  Minor typographical errors may be present. If questions arise, please do not hesitate to contact me or call our department.      Electronically signed by Sherril Barter, DO at 10/07/2020  8:04 PM EST

## 2020-10-07 NOTE — ED Triage Notes (Signed)
 Formatting of this note might be different from the original.  Patient c/o possible abscess to right breast. She states that the abscess occurred along prior incision line related to breast reduction. She states prior abscess to same area in August. She states abscess responded to abx at that time.  C/o pain to right breast.  Denies drainage from site.   Electronically signed by Haydee Dene Czar, RN at 10/07/2020  6:57 PM EST

## 2020-10-07 NOTE — ED Provider Notes (Signed)
EMERGENCY DEPARTMENT HISTORY AND PHYSICAL EXAM    I have evaluated the patient at 7:58 PM      Date: 10/07/2020  Patient Name: Monique Rogers    History of Presenting Illness     Chief Complaint   Patient presents with   ??? Breast Problem   ??? Abscess         History Provided By: Patient  Location/Duration/Severity/Modifying factors   34 year old female with history of reduction mammoplasty presenting to the emergency department with complaint of left breast pain and irritation.  Patient reports has been ongoing the past 2 days.  She reports some tenderness and erythema of the skin at the 1 o'clock position of her medial breast.  Patient reports that she has had this previously in the past and was given Keflex with improvement.  However, feels like it flares up and from time to time.  She has tried a warm compress on it at home.  She was advised by one of her friends to come to the emergency department for possible drainage of an abscess.  She has not noted any drainage from the area.          PCP: None    Current Facility-Administered Medications   Medication Dose Route Frequency Provider Last Rate Last Admin   ??? cephALEXin (KEFLEX) capsule 500 mg  500 mg Oral NOW Earlie Counts, DO         Current Outpatient Medications   Medication Sig Dispense Refill   ??? cephALEXin (Keflex) 500 mg capsule Take 1 Capsule by mouth four (4) times daily for 7 days. 28 Capsule 0   ??? levonorgestreL (Mirena) 20 mcg/24 hours (5 yrs) 52 mg IUD 1 Device by IntraUTERine route once.         Past History     Past Medical History:  Past Medical History:   Diagnosis Date   ??? Skin abscess 06/2020       Past Surgical History:  Past Surgical History:   Procedure Laterality Date   ??? HX BACK SURGERY     ??? HX BREAST REDUCTION  2017   ??? HX CESAREAN SECTION         Family History:  History reviewed. No pertinent family history.    Social History:  Social History     Tobacco Use   ??? Smoking status: Never Smoker   ??? Smokeless tobacco: Never Used    Substance Use Topics   ??? Alcohol use: Never   ??? Drug use: Never       Allergies:  Allergies   Allergen Reactions   ??? Latex Rash   ??? Pcn [Penicillins] Rash         Review of Systems       Review of Systems   Constitutional: Negative for activity change, chills, diaphoresis, fatigue and fever.   Eyes: Negative for photophobia, pain and visual disturbance.   Respiratory: Negative for cough, chest tightness, shortness of breath, wheezing and stridor.    Cardiovascular: Negative for chest pain and palpitations.   Gastrointestinal: Negative for abdominal distention, abdominal pain, constipation, diarrhea, nausea and vomiting.   Genitourinary: Negative for difficulty urinating, dysuria and hematuria.   Musculoskeletal: Negative for back pain, joint swelling and myalgias.   Skin: Negative for rash and wound.        Area of erythema, tenderness and induration on right breast   Neurological: Negative for dizziness, weakness and headaches.   Psychiatric/Behavioral: Negative for agitation. The patient is not nervous/anxious.  Physical Exam     Visit Vitals  BP 125/71 (BP 1 Location: Left upper arm, BP Patient Position: At rest)   Pulse 72   Temp 98.5 ??F (36.9 ??C)   Resp 18   Ht 5' (1.524 m)   Wt 103.4 kg (228 lb)   LMP 09/18/2020   SpO2 100%   BMI 44.53 kg/m??         Physical Exam  Exam conducted with a chaperone present (chaperoned by Ignacia Felling, RN).   Constitutional:       General: She is not in acute distress.     Appearance: She is not toxic-appearing.   HENT:      Head: Normocephalic and atraumatic.      Mouth/Throat:      Mouth: Mucous membranes are moist.   Eyes:      Extraocular Movements: Extraocular movements intact.      Pupils: Pupils are equal, round, and reactive to light.   Cardiovascular:      Rate and Rhythm: Normal rate and regular rhythm.      Heart sounds: Normal heart sounds. No murmur heard.  No friction rub. No gallop.    Pulmonary:      Effort: Pulmonary effort is normal.      Breath  sounds: Normal breath sounds.   Abdominal:      General: There is no distension.      Palpations: Abdomen is soft. There is no mass.      Tenderness: There is no abdominal tenderness. There is no guarding.      Hernia: No hernia is present.   Musculoskeletal:         General: No swelling, tenderness or deformity.      Cervical back: Normal range of motion and neck supple.   Skin:     General: Skin is warm and dry.      Capillary Refill: Capillary refill takes less than 2 seconds.      Findings: Erythema present. No rash.      Comments: 1x3 cm area of induration, erythema and tenderness. No underlying fluctuance appreciated.    Neurological:      General: No focal deficit present.      Mental Status: She is alert and oriented to person, place, and time.   Psychiatric:         Mood and Affect: Mood normal.           Diagnostic Study Results     Labs -  No results found for this or any previous visit (from the past 12 hour(s)).    Radiologic Studies -   No orders to display         Medical Decision Making   I am the first provider for this patient.    I reviewed the vital signs, available nursing notes, past medical history, past surgical history, family history and social history.    Vital Signs-Reviewed the patient's vital signs.      Records Reviewed: Nursing Notes (Time of Review: 7:58 PM)    ED Course: Progress Notes, Reevaluation, and Consults:         Provider Notes (Medical Decision Making):   MDM  Number of Diagnoses or Management Options  Acute pain of breast  Diagnosis management comments: 34 year old female presenting to the emergency department for evaluation of what she believes is a breast abscess on her right breast.  There is an indurated area that is tender to palpation and there is no appreciable  fluctuance.  Bedside ultrasound shows no pocket large enough to drain via I&D. Appears more keloidal in nature rather than cellulitic. However,  will treat patient with Keflex and discharged with  prescription for this.  She has also been instructed on warm compresses as well as changing out her bra so that she is not achieving her bleeding irritation in that area.  ED return precautions have been discussed.  Patient verbalizes good understanding agreement plan.          Diagnosis     Clinical Impression:   1. Acute pain of breast        Disposition: home    Follow-up Information     Follow up With Specialties Details Why Contact Info    [facility] Emergency Medicine  As needed, If symptoms worsen [address]  [place] [id number]  [phone]           Patient's Medications   Start Taking    CEPHALEXIN (KEFLEX) 500 MG CAPSULE    Take 1 Capsule by mouth four (4) times daily for 7 days.   Continue Taking    LEVONORGESTREL (MIRENA) 20 MCG/24 HOURS (5 YRS) 52 MG IUD    1 Device by IntraUTERine route once.   These Medications have changed    No medications on file   Stop Taking    ACETAMINOPHEN (TYLENOL EXTRA STRENGTH) 500 MG TABLET    Take 2 Tabs by mouth every six (6) hours as needed for Pain.    CYCLOBENZAPRINE (FLEXERIL) 10 MG TABLET    Take 1 Tablet by mouth three (3) times daily as needed for Muscle Spasm(s).    LIDOCAINE (XYLOCAINE) 5 % OINTMENT    Apply 1 Tube to affected area as needed for Pain.     Disclaimer: Sections of this note are dictated using utilizing voice recognition software.  Minor typographical errors may be present. If questions arise, please do not hesitate to contact me or call our department.

## 2020-10-07 NOTE — ED Notes (Signed)
Patient c/o possible abscess to right breast. She states that the abscess occurred along prior incision line related to breast reduction. She states prior abscess to same area in August. She states abscess responded to abx at that time.  C/o pain to right breast.  Denies drainage from site.

## 2020-10-07 NOTE — Unmapped External Note (Signed)
 Formatting of this note might be different from the original.  Monique Rogers is a 34 y.o. female that was discharged in stable. Pt was accompanied by self. Pt is driving. The patients diagnosis, condition and treatment were explained to  patient and aftercare instructions were given.  The patient verbalized understanding. Patient armband removed and shredded.  Electronically signed by Jarvis Rocky FALCON, RN at 10/07/2020  8:38 PM EST

## 2020-10-08 MED ORDER — CEPHALEXIN 500 MG CAP
500 mg | ORAL_CAPSULE | Freq: Four times a day (QID) | ORAL | 0 refills | Status: AC
Start: 2020-10-08 — End: 2020-10-14

## 2020-10-08 MED ORDER — CEPHALEXIN 250 MG CAP
250 mg | ORAL | Status: AC
Start: 2020-10-08 — End: 2020-10-07
  Administered 2020-10-08: 02:00:00 via ORAL

## 2020-10-08 MED FILL — CEPHALEXIN 250 MG CAP: 250 mg | ORAL | Qty: 2

## 2021-01-06 ENCOUNTER — Ambulatory Visit: Admit: 2021-01-06 | Discharge: 2021-01-06 | Payer: PRIVATE HEALTH INSURANCE | Attending: Surgery

## 2021-01-06 ENCOUNTER — Encounter

## 2021-01-06 ENCOUNTER — Inpatient Hospital Stay: Admit: 2021-01-06 | Payer: PRIVATE HEALTH INSURANCE

## 2021-01-06 ENCOUNTER — Ambulatory Visit: Attending: Surgery

## 2021-01-06 LAB — SENTARA SPECIMEN COLLN.

## 2021-01-06 NOTE — Progress Notes (Signed)
 Chief Complaint   Patient presents with   . Advice Only     confirmed video     Pt ID confirmed    Weight Loss Metrics 01/06/2021 01/06/2021 10/07/2020 06/20/2020 07/14/2019   Pre op / Initial Wt 253 - - - -   Today's Wt - 253 lb 228 lb 228 lb 248 lb   BMI - 47.8 kg/m2 44.53 kg/m2 44.53 kg/m2 48.43 kg/m2   Ideal Body Wt 122 - - - -   Excess Body Wt 131 - - - -   Goal Wt 148 - - - -   Wt loss to date 0 - - - -   % Wt Loss 0 - - - -   80% EBW 104.8 - - - -       Body mass index is 47.8 kg/m.

## 2021-01-06 NOTE — Progress Notes (Addendum)
Progress Notes by Edyth GunnelsKudav, Marleah Beever, MD at 01/06/21 [id number]                Author: Edyth GunnelsKudav, Eveleigh Crumpler, MD  Service: --  Author Type: Physician       Filed: 02/23/21 0918  Encounter Date: 01/06/2021  Status: Addendum          Editor: Edyth GunnelsKudav, Venise Ellingwood, MD (Physician)          Related Notes: Original Note by Edyth GunnelsKudav, Manolo Bosket, MD (Physician) filed at 01/16/21 1014                Bariatric Surgery Consultation      CC: Consultation regarding surgical treatment options for morbid obesity and related comorbidities     Subjective:        The patient is a 35 y.o. obese African American female  with a Body mass index is 47.8 kg/m??.Marland Kitchen. They have been considering surgery for some time now. The patient presents to the clinic today to  discuss surgical weight loss options. They have made multiple attempts at weight loss over the years without success. They have tried low-carb, low calorie, high-protein diets, keto, Weight Watchers, intermittent fasting, contrave and phentermine. Unfortunately,  none of these have lead to meaningful, sustained weight loss. They have attended the bariatric seminar before the visit.       Denies nausea, vomiting, dysphagia   reports reflux occasionally, triggered by red sauces, responds to baking soda or PRN PPI.      History of RUQ discomfort after eating meals in the past. Last episode was a week ago.      Sleep Apnea assessment:      STOPBANG questionnaire       Do you Snore loudly? no   Do you often feel Tired, fatigued, or sleepy during the daytime? yes   Has anyone Observed you stop breathing during your sleep? no   Are you being treated for high blood Pressure? no   BMI more than 35 kg/m2? yes   Age over 34 years old? no   Neck Circumference >16 inches? no   Gender female? no   ______________________________________       SCORE: 2       If YES to 0 - 2, low risk of sleep apnea   If YES to 3 - 4 intermediate risk of having sleep apnea   If YES to 5 - 8 high risk of having sleep apnea (or 2 + BMI  35 or Neck > 17" or Female)       Pre op weight: 253   EBW: 131   Goal Weight Calc Women: 148.2   Wt loss to date: 0         Patient Active Problem List           Diagnosis  Date Noted         ?  Morbid obesity (HCC)  01/06/2021         ?  Chronic low back pain  01/06/2021           Past Medical History:        Diagnosis  Date         ?  Arthritis       ?  Lumbar pars defect           ?  Skin abscess  06/2020          Past Surgical History:         Procedure  Laterality  Date          ?  HX BACK SURGERY              hardware in back          ?  HX BREAST REDUCTION    2017          ?  HX CESAREAN SECTION               Social History          Tobacco Use         ?  Smoking status:  Former Smoker              Quit date:  2012         Years since quitting:  10.1         ?  Smokeless tobacco:  Never Used       Substance Use Topics         ?  Alcohol use:  Yes             Comment: special occasions         History reviewed. No pertinent family history.      Prior to Admission medications             Medication  Sig  Start Date  End Date  Taking?  Authorizing Provider            levonorgestreL (Mirena) 20 mcg/24 hours (5 yrs) 52 mg IUD  1 Device by IntraUTERine route once.      Yes  Other, Phys, MD          Allergies        Allergen  Reactions         ?  Latex  Rash         ?  Pcn [Penicillins]  Rash            Review of Systems:     Constitutional: No fever or chills  Neurologic: No headache   Eyes: No scleral icterus or irritated eyes   Nose: No nasal pain or drainage   Mouth: No oral lesions or sore throat   Cardiac: No palpations or chest pain   Pulmonary: No cough or shortness of breath   Gastrointestinal: No nausea, emesis, diarrhea, or constipation   Genitourinary: No dysuria   Musculoskeletal: No muscle or joint tenderness   Skin: No rashes or lesions   Psychiatric: No anxiety or depressed mood      Pertinent positives: see HPI           Objective:        Physical Exam:   Visit Vitals      BP  136/80     Pulse  64      Temp  97.9 ??F (36.6 ??C)     Resp  18     Ht  5\' 1"  (1.549 m)     Wt  114.8 kg (253 lb)     SpO2  99%        BMI  47.80 kg/m??        General: No acute distress, conversant   Eyes: PERRLA, no scleral icterus   HENT: Normocephalic without oral lesions   Neck: Trachea midline   Cardiac: Normal pulse rate and rhythm, no edema, capillary refill normal 1 second   Pulmonary: Symmetric chest rise with normal effort, clear to auscultation though mildly obscured by body habitus  GI: Soft, NT, ND, no hernia, no splenomegaly   Skin: Warm without rash   Extremities: No edema or joint stiffness, moves all extremities symmetrically, steady gait   Psych: Appropriate mood and affect        Assessment:        Morbid obesity with comorbidities     Plan:     The patient presents today with severe obesity and obesity related risks/comorbidities as detailed above. The patient has made multiple unsuccessful attempts at weight loss as detailed  above. The patient desires surgical weight loss for a better chance of lifelong weight control and control of co-morbidities. They have attended the bariatric seminar and meet the qualifications for surgery based on the NIH criteria. We have discussed  the various surgical options including the sleeve gastrectomy, gastric bypass, duodenal switch/modified duodenal switch. We also discussed non operative alternatives. The patient is currently interested in the  sleeve gastrectomy. I believe they are a good candidate for this procedure.      They will need to a/an UGI to evaluate their anatomy pre operatively. H pylori  antigen/breath test ordered as well.      We have discussed the possible complications of bariatric surgery which include but are not limited to failed weight loss, weight regain, malnutrition, leak, bleed, stricture, gastric ulcer, gastric fistula, gastric bleed, gallstones, new or worsening  gastric reflux, nausea, emesis, internal hernia, abdominal wall hernia, gastric  perforation, need for revision / conversion / or reversal, pregnancy complications and loss, intestinal ischemia, post operative skin complications, possible thinning of their  hair, bowel obstruction, dumping syndrome, wound infection, blood clots (DVT, mesenteric thrombus, pulmonary embolism), increased addictive tendency, risk of anesthesia, and death.       The patient understands this is a life altering decision and will require compliance to the program for the remainder of their life in order to be monitored to avoid complication and ensure successful, sustained weight control. They will be placed on  a lifelong low carbohydrate and low sugar diet, exercise, and vitamin regimen and will require frequent blood draws and office visits to ensure adequate nutrition and program compliance. Visits and follow up will be in compliance with the guidelines set  forth by [facility]. I have specifically mentioned the need to avoid all personal and second-hand tobacco exposure, systemic steroids, and NSAIDS after any bariatric surgery to help avoid the above listed complications. The patient has expressed understanding  of the above and would like to enroll in the program. The patient will be submitted for medical and psychological clearance along with establishing with out dietician and joining the pre / post operative support group. They will be screened for depression  and sleep apnea and treated pre operatively if needed. After successful completion of the preoperative regimen the patient will be submitted for insurance approval and pending this will be scheduled for surgery.      Tests/clearances ordered:   CBC, CMP, A1c, H pylori, Vitamin D, CXR, EKG, UGI, RUQ [place]   Nutrition, support group, PCP clearance, psychological clearance         All questions from the patient have been answered and they have demonstrated appropriate understanding of the process.      RESULTS:  UGI: GER, otherwise unremarkable   RUQ [place]:  unremarkable   CXR: summation artifact likely otherwise unremarkable         Signed By:  Edyth Gunnels, MD [facility]   Bariatric and General Surgeon   [facility]          January 06, 2021

## 2021-01-10 LAB — H. PYLORI BREATH TEST
H PYLORI (UREA BREATH),1814839: NEGATIVE
H Pylori (Urea Breath): NEGATIVE

## 2021-01-15 ENCOUNTER — Inpatient Hospital Stay: Admit: 2021-01-15 | Payer: MEDICAID | Attending: Surgery

## 2021-01-15 DIAGNOSIS — K219 Gastro-esophageal reflux disease without esophagitis: Secondary | ICD-10-CM

## 2021-01-15 MED ORDER — SOD BICARB-CITRIC AC-SIMETH 2.21 GRAM-1.53 GRAM/4 GRAM ORAL GRAN IN PK
Freq: Once | ORAL | Status: AC
Start: 2021-01-15 — End: 2021-01-15
  Administered 2021-01-15: 14:00:00 via ORAL

## 2021-01-15 MED ORDER — BARIUM SULFATE 96 % (W/W) ORAL SUSP, RECON
96 % (w/w) | Freq: Once | ORAL | Status: AC
Start: 2021-01-15 — End: 2021-01-15
  Administered 2021-01-15: 14:00:00 via ORAL

## 2021-01-15 MED ORDER — BARIUM SULFATE 98 % ORAL SUSP, RECON
98 % | Freq: Once | ORAL | Status: AC
Start: 2021-01-15 — End: 2021-01-15
  Administered 2021-01-15: 14:00:00 via ORAL

## 2021-01-15 MED FILL — E-Z-HD BARIUM 98 % ORAL SUSPENSION: 98 % | ORAL | Qty: 135

## 2021-01-15 MED FILL — E-Z-PAQUE 96 % (W/W) ORAL POWDER FOR SUSPENSION: 96 % (w/w) | ORAL | Qty: 176

## 2021-01-15 MED FILL — E-Z-GAS II 2.21 GRAM-1.53 GRAM/4 GRAM ORAL EFFERVESCENT GRANULES PACK: ORAL | Qty: 1

## 2021-01-21 ENCOUNTER — Inpatient Hospital Stay: Admit: 2021-01-21 | Payer: MEDICAID

## 2021-01-21 NOTE — Progress Notes (Signed)
[facility]  [address] [facility] [address], [address]    Patient's Name: Monique Rogers   Age: 35 y.o.  Date of Birth: November 08, 1986   Sex: female     Insurance:            Session: 1 of  6  Revision:   Surgeon:  Dr. Erskine Speed  Date: 01/21/2021    Height: 5 f 1 Weight:    253      Lbs.   BMI:    Pounds Lost since last month: 0               Pounds Gained since last month: 0      Starting Weight: 253   Previous Month's Weight: 253  Overall Pounds Lost: 0 Overall Pounds Gained: 0      Do you smoke?  None    Alcohol intake:  Number of drinks at a time:  Yes, on occasion  Number of times a week:     Patient has attended a support group.  Information was provided.     Class Guidelines    Guidelines are reviewed with patient at the start of every class.    1. Patient understands that weight loss trial classes must be consecutive.  Patient understands if they miss a class, it is their responsibility to contact me to reschedule class.  I will reach out to patient after their first no show.  2.  Patient understands the expectations that weight maintenance/weight loss is expected during the classes.  Failure to demonstrate changes may result in one extra month of weight loss trial, followed by going back to see the surgeon.  Patient understands that they CAN NOT gain any weight during the weight loss trial.  Gaining weight will result in extra classes.  3. Patient is also instructed to be doing their labs, blood work, psych visit, support group and any other test that the surgeon has used while they are working on their weight loss trial.  4.  Patient was instructed to bring their blue binder to every class and appointment.      Other Pertinent Information:     Changes Made Since Last Class: First clsas    Eating Habits and Behaviors    Today in class, we reviewed the key diet principles.  I have talked to patient about pushing the fluid and working towards 64 ounces per day.     Patient was given ideas of liquids that would be okay.  Patient was encouraged to cut out liquid calories, such as soda and sweet tea.  We talked about the reasons that sugar sweetened beverages can promote weight gain.  Sugar is highly palatable.  Excessive consumption of sugar can trigger an exaggerated release of dopamine, which can promote a compulsive drive to consume more sugar sweetened beverages.  Also, satiety is not reached with liquid calories the same way it does with solid calories.    In class, we also talked about focusing on protein and low carbohydrates.  Patient was encouraged during the weight loss trial to keep their carbohydrate less than 75 grams per day and their protein level at 60-80 grams per day.  We talked about meal choices and snack ideas.      Patient was given a packet on carbohydrate substitutions and recipe exchanges.  This will allow them to still have some of the foods they enjoy, but a lower carbohydrate alternative.  Patient's current diet habits include: Patient is eating 2-3 meals per day.  Patient is eating [employer] king sausage and egg cheese croissant, pepperoni pizza, air fried chicken wing at meals.  Patient states their portions are regular plates.  Patient is not measuring portions out.  I have recommended patient to use a smaller plate and to drink water prior to their meal to help fill them up.  We talked about eating and drinking post op and stopping 30 minutes before and after.  Patient indicates they are eating out 7 times a week.  This includes fast food, carry out, and sit down restaurants.  I have talked to patient about starting to plan ahead to cut down on eating out.  Patient indicates their indicate of bread, rice, pasta, crackers to be few times a week on bread.  Patient is snacking few times a day on cherries, strawberries, cookies.  Sweet intake is Carb smart vanilla ice cream.  I have talked to patient about the importance of focusing on protein at  meals.  We talked about trying to limit carbohydrates.   Patient is drinking 64 ounces of water, 12 ounces soda, 0 ounces of sweet tea, 0 ounces of fruit juices, and 9 ounces of caffeine.  Patient was encouraged to aim for 64 ounces daily.  I talked about focusing on zero calorie liquids and not drinking calories and weaning from caffeine.           Physical Activity/Exercise    Comments:    We talked about exercise.  Patient was given reasons of why exercise is so important and how that can help with their long-term success.  I have encouraged patient to get a support system to help with the activity.    Patient is currently doing walking for 2-3 miles, 3x a weeek for activity.    Patient's plan to incorporate more activity includes: Continue with current routine.      Behavior Modification       Comments:  During today's lesson, I also spent some time talking about behavior changes.  I talked to patient about the importance of taking vitamins post op and we reviewed the vitamins that patients will be taking post op.  Patient will hear this again at pre op class before surgery.  Patient had the opportunity to ask questions about these vitamins that will be lifelong.      Goals that patient wants to work on includes:  1. Work on getting adequate protein  2. Make healthier choices.       Adela Ports, MS RD  01/21/2021

## 2021-02-19 ENCOUNTER — Inpatient Hospital Stay: Admit: 2021-02-19 | Payer: MEDICAID

## 2021-02-19 DIAGNOSIS — Z01818 Encounter for other preprocedural examination: Secondary | ICD-10-CM

## 2021-02-19 LAB — SENTARA SPECIMEN COLLN.

## 2021-02-20 LAB — CBC WITH AUTO DIFFERENTIAL
ABSOLUTE LYMPHOCYTE COUNT, 10803: 2.1 10*3/uL (ref 1.0–4.8)
Basophils %: 1 % (ref 0–2)
Basophils Absolute: 0.1 10*3/uL (ref 0.0–0.2)
Eosinophils %: 4 % (ref 0–6)
Eosinophils Absolute: 0.3 10*3/uL (ref 0.0–0.5)
Hematocrit: 41.3 % (ref 35.1–46.5)
Hemoglobin: 12.9 g/dL (ref 11.7–15.5)
Lymphocytes %: 30 % (ref 20–45)
MCH: 26 pg (ref 26–34)
MCHC: 31 g/dL (ref 31–36)
MCV: 83 fL (ref 80–99)
MPV: 12.2 fL (ref 9.0–13.0)
Monocytes %: 9 % (ref 3–12)
Monocytes Absolute: 0.6 10*3/uL (ref 0.1–1.0)
Neutrophils %: 56 % (ref 40–75)
Neutrophils Absolute: 3.9 10*3/uL (ref 1.8–7.7)
Platelets: 234 10*3/uL (ref 140–440)
RBC: 4.97 M/uL (ref 3.80–5.20)
RDW: 14.6 % (ref 10.0–15.5)
WBC: 6.9 10*3/uL (ref 4.0–11.0)

## 2021-02-20 LAB — EKG 12-LEAD
Atrial Rate: 70 {beats}/min
Diagnosis: NORMAL
P Axis: 34 degrees
P-R Interval: 162 ms
Q-T Interval: 388 ms
QRS Duration: 84 ms
QTc Calculation (Bazett): 419 ms
R Axis: 30 degrees
T Axis: 32 degrees
Ventricular Rate: 70 {beats}/min

## 2021-02-20 LAB — COMPREHENSIVE METABOLIC PANEL
ALT: 17 U/L (ref 5–40)
AST: 23 U/L (ref 10–37)
Albumin/Globulin Ratio: 1.4 ratio (ref 1.1–2.6)
Albumin: 4.2 g/dL (ref 3.5–5.0)
Alkaline Phosphatase: 74 U/L (ref 25–115)
Anion Gap: 12 mmol/L (ref 3.0–15.0)
BUN: 13 mg/dL (ref 6–22)
CO2: 23 mmol/L (ref 20–32)
Calcium: 9.8 mg/dL (ref 8.4–10.5)
Chloride: 105 mmol/L (ref 98–110)
Creatinine: 1 mg/dL (ref 0.5–1.2)
GFR African American: >60 (ref >60.0)
GFR Non-African American: >60 (ref >60.0)
Globulin: 2.9 g/dL (ref 2.0–4.0)
Glucose: 86 mg/dL (ref 70–99)
Potassium: 4.1 mmol/L (ref 3.5–5.5)
Sodium: 140 mmol/L (ref 133–145)
Total Bilirubin: 0.4 mg/dL (ref 0.2–1.2)
Total Protein: 7.1 g/dL (ref 6.4–8.3)

## 2021-02-20 LAB — HEMOGLOBIN A1C W/O EAG
AVERAGE GLUCOSE: 111 mg/dL (ref 91–123)
AVG GLU: 111 mg/dL (ref 91–123)
Hemoglobin A1C: 5.5 % (ref 4.8–5.6)
Hemoglobin A1c: 5.5 % (ref 4.8–5.6)

## 2021-02-20 LAB — VITAMIN D 25 HYDROXY: Vit D, 25-Hydroxy: 24.4 ng/mL — ABNORMAL LOW (ref 32.0–100.0)

## 2021-02-20 LAB — METABOLIC PANEL, COMPREHENSIVE
A-G Ratio: 1.4 ratio (ref 1.1–2.6)
ALT (SGPT): 17 U/L (ref 5–40)
AST (SGOT): 23 U/L (ref 10–37)
Albumin: 4.2 g/dL (ref 3.5–5.0)
Alk. phosphatase: 74 U/L (ref 25–115)
Anion gap: 12 mmol/L (ref 3.0–15.0)
BUN: 13 mg/dL (ref 6–22)
Bilirubin, total: 0.4 mg/dL (ref 0.2–1.2)
CO2: 23 mmol/L (ref 20–32)
Calcium: 9.8 mg/dL (ref 8.4–10.5)
Chloride: 105 mmol/L (ref 98–110)
Creatinine: 1 mg/dL (ref 0.5–1.2)
GFRAA: >60 (ref >60.0)
GFRNA: >60 (ref >60.0)
Globulin: 2.9 g/dL (ref 2.0–4.0)
Glucose: 86 mg/dL (ref 70–99)
Potassium: 4.1 mmol/L (ref 3.5–5.5)
Protein, total: 7.1 g/dL (ref 6.4–8.3)
Sodium: 140 mmol/L (ref 133–145)

## 2021-02-20 LAB — EKG, 12 LEAD, INITIAL
Atrial Rate: 70 {beats}/min
Calculated P Axis: 34 degrees
Calculated R Axis: 30 degrees
Calculated T Axis: 32 degrees
Diagnosis: NORMAL
P-R Interval: 162 ms
Q-T Interval: 388 ms
QRS Duration: 84 ms
QTC Calculation (Bezet): 419 ms
Ventricular Rate: 70 {beats}/min

## 2021-02-20 LAB — CBC WITH AUTOMATED DIFF
ABS. BASOPHILS: 0.1 10*3/uL (ref 0.0–0.2)
ABS. EOSINOPHILS: 0.3 10*3/uL (ref 0.0–0.5)
ABS. MONOCYTES: 0.6 10*3/uL (ref 0.1–1.0)
ABS. NEUTROPHILS: 3.9 10*3/uL (ref 1.8–7.7)
ABSOLUTE LYMPHOCYTE COUNT: 2.1 10*3/uL (ref 1.0–4.8)
BASOPHILS: 1 % (ref 0–2)
EOSINOPHILS: 4 % (ref 0–6)
HCT: 41.3 % (ref 35.1–46.5)
HGB: 12.9 g/dL (ref 11.7–15.5)
Lymphocytes: 30 % (ref 20–45)
MCH: 26 pg (ref 26–34)
MCHC: 31 g/dL (ref 31–36)
MCV: 83 fL (ref 80–99)
MONOCYTES: 9 % (ref 3–12)
MPV: 12.2 fL (ref 9.0–13.0)
NEUTROPHILS: 56 % (ref 40–75)
PLATELET: 234 10*3/uL (ref 140–440)
RBC: 4.97 M/uL (ref 3.80–5.20)
RDW: 14.6 % (ref 10.0–15.5)
WBC: 6.9 10*3/uL (ref 4.0–11.0)

## 2021-02-20 LAB — VITAMIN D, 25 HYDROXY: VITAMIN D, 25-HYDROXY: 24.4 ng/mL — ABNORMAL LOW (ref 32.0–100.0)

## 2021-02-25 ENCOUNTER — Inpatient Hospital Stay: Admit: 2021-02-25 | Payer: MEDICAID

## 2021-02-25 NOTE — Progress Notes (Signed)
 [facility]  [address] [facility] [address], [address]    Patient's Name: Monique Rogers   Age: 35 y.o.  Date of Birth: [birth year]   Sex: female    Date:   02/25/2021    Insurance:              Session: 2 of  6  Surgeon:  Dr. Harden    Height: 5 f 1   Weight:    250      Lbs.     BMI:    Pounds Lost since last month: 3                 Pounds Gained since last month: 0    Starting Weight: 253     Previous Month's Weight: 253  Overall Pounds Lost: 3   Overall Pounds Gained: 0    Patient has not been to a support group meeting.    Do you smoke?  None    Alcohol intake:  Number of drinks at a time:  Occasionally  Number of times a week:     Class Guidelines    Guidelines are reviewed with patient at the start of every class.    1. Patient understands that weight loss trial classes must be consecutive.  Patient understands if they miss a class, it is their responsibility to contact me to reschedule class.  I will reach out to patient after their first no show.  2.  Patient understands the expectations that weight maintenance/weight loss is expected during the classes.  Failure to demonstrate changes may result in one extra month of weight loss trial, followed by going back to see the surgeon.  Patient understands that they CAN NOT gain any weight during the weight loss trial.  Gaining weight will result in extra classes.  3. Patient is also instructed to be doing their labs, blood work, psych visit, support group and any other test that the surgeon has used while they are working on their weight loss trial.  4.  Patient was instructed to bring their blue binder to every class and appointment.      Other Pertinent Information:     Changes Made Since Last Class: Being more conscious of her eating habits    Eating Habits and Behaviors      We started off class today by reviewing key diet principles.  Patient was given a very specific list of foods that they can eat, which  included meat, fish, vegetables, eggs, cheese, fats, soy, and berries.  Patient was also given a list of foods that should be avoided.  These included sweets (candy, soda, baked goods, ice cream), and starches, including pasta, rice, crackers, chips, oatmeal, bread.  We talked about appropriate protein-based snacks, including deli meat, low fat cheese, yogurt, hummus, small handful of almonds.  We talked about working on sipping fluid throughout the day and working towards 64 ounces.  I have recommended water, Crystal light, sugar free drinks, but eliminating soda, sweet tea, and fruit juices.      I also gave a power point on ways to help with the metabolism.  Some ways that can help boost one's metabolism include healthy snacking.  Eating 6 small meals in the pre op phase can help keep the metabolism revved up.  It is recommended to focus on protein-based snacks.   Exercise is another way to increase resting metabolic rate.  The more lean  muscle one has, the more calories they will burn at rest.  Dehydration can slow down one's metabolism, as well.  Patient is encouraged to keep sipping to work towards 64 ounces of fluid per day.  Protein is another booster for metabolism.  Foods high in carbohydrates and fat slow down the metabolism.      Patient's current diet habits include: Patient is eating 3 meals a day.  Patient is eating [place] bacon, boiled egg, slice of wheat toast, salad with grilled chicken, cauliflower mac and cheese at meals.  Patient states their portion sizes are regular plate, but making the adjustment this wek.  I have encouraged patient to use a smaller plate and fill up on water before meals to help cut down on portions.  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  Carry out intake is: 1 x  week.  Sit down restaurant intake is: 0.   Patient's is eating bread, rice, pasta, cereal, and other carbohydrates states she eats more  bread than she should.    Patient is snacking on protein, cheese, sometimes chips.  This is being done when she is studying..  Patient's sweet intake is 0.  I have talked about the importance of getting into the habit now of cutting the sweets out.      Fluid intake:  0 ounces of water, crystal light, unsweetened tea.  0 ounces of soda, 0 ounces of sweet tea, 0 ounces of fruit juice, 0 ounces of caffeine.  Patient is encouraged not to drink calories and stick to non-carbonated, non-caffeine, sugar free drinks.  The goal is 64 ounces of fluid per day.      Physical Activity/Exercise    Comments:     Currently for exercise, patient is tracking steps and aiming for 10,000 steps per day.  We talked about activities for patient to do, including walking, swimming, or chair exercises.  I also talked with patient about doing some strength training, which helps the metabolism, as well.  Patient understands the importance of establishing an activity routine and that surgery is only a small piece of puzzle, but exercise and diet changes will play a role in long term success.    Behavior Modification       Comments:   In the power point, Mastering Your Metabolism, I also gave behaviors that can help with one's metabolism.  These include not skipping meals and being sure to feed and fuel that body rather than going large gaps and putting the body in starvation mode.  Patient is encouraged to eat within 1 hour of waking up and have a cut off time in the evening.  We talked about night time syndrome where a person may skip breakfast and lunch and then consume the majority of their calories from dinner until bed time.  I  have talked about how important it it to get 3 meals in per day, eat breakfast, and BREAK THE FAST.    One goal for next month includes:  1.  Use a smaller plate  2. No bread or fast food at all.      Jacqueline Browning, MS RD  02/25/2021

## 2021-03-25 ENCOUNTER — Inpatient Hospital Stay: Admit: 2021-03-25 | Payer: MEDICAID

## 2021-03-25 NOTE — Progress Notes (Signed)
[facility] [place] [facility]  [address] [facility] [address], [address]    Patient's Name: Monique Rogers   Age: 35 y.o.  Date of Birth: 12-23-1985   Sex: female        Insurance:              Session: 3 of  6  Revision:     Surgeon:  Dr. Erskine Speed    Height: 5 f 1   Weight:    250      Lbs.     BMI:    Pounds Lost since last month: 0                 Pounds Gained since last month: 0    Starting Weight: 253     Previous Month's Weight: 250  Overall Pounds Lost: 3   Overall Pounds Gained: 0    Patient has been to support group.    Do you smoke?  None    Alcohol intake:  Number of drinks at a time:  None  Number of times a week: None    Class Guidelines    Guidelines are reviewed with patient at the start of every class.    1. Patient understands that weight loss trial classes must be consecutive.  Patient understands if they miss a class, it is their responsibility to contact me to reschedule class.  I will reach out to patient after their first no show.  2.  Patient understands the expectations that weight maintenance/weight loss is expected during the classes.  Failure to demonstrate changes may result in one extra month of weight loss trial, followed by going back to see the surgeon.  Patient understands that they CAN NOT gain any weight during the weight loss trial.  Gaining weight will result in extra classes.  3. Patient is also instructed to be doing their labs, blood work, psych visit, support group and any other test that the surgeon has used while they are working on their weight loss trial.  4.  Patient was instructed to bring their blue binder to every class and appointment.      Other Pertinent Information:     Changes Made Since Last Class: Focusing on carbohydrates intake.  Making adjustments to her pantry.    Eating Habits and Behaviors      Today in class we talked about the key diet principles.  We start off each class talking about these principles, which include  cutting out liquid calories and focusing on water or other non-calorie, non-carbonated drinks.  We also spent time talking about carbohydrates, including foods that have carbohydrates and the goal to keep daily carbohydrates under 100 grams per day.  Patient was given ideas of meal and snack choices that are lower in carbohydrates and focus more on protein.  Patient was encouraged to start trying protein shakes and was given a list of suggestions.    The main topic of class today was: Portion Control.   We reviewed in class a power point filled with tips on ways to control portions, including using smaller plates, boxing up portions at a restaurant before starting to eat, and not eating from the container, but rather portioning snacks into smaller bags.  Patient's were encouraged to food journal, which helps increase awareness of what and how much they are eating.  It was emphasized to patient the importance of reading labels and portion sizes, but also applying these  portion sizes.  Patient was given a list of items that can help to make portion control easier.  For example, a deck of cards or a palm of a hand is a proper portion of meat, a fist is a cup or a proper serving of vegetables.  Patient was given 10 tips to help with the portion control.      Patient's current diet habits include: Patient's current diet habits include: Patient is eating 3 meals a day.  Patient states their portion sizes are eating smaller portions and practicing portion control.  I have encouraged patient to use a smaller plate and fill up on water before meals to help cut down on portions.  Patient is eating fast food: stopped last month.  States she can see a difference in her bank account and body.  Carry out intake is: None.  Sit down restaurant intake is: not often.   Patient's is eating bread, rice, pasta, cereal, and other carbohydrates cut back and is trying to make better choices, like using a lettuce wrap.    Patient is  snacking on almond and walnuts, more fruit, protein bar.  This is being done 1 times a day.  Patient's sweet intake is None.  I have talked about the importance of getting into the habit now of cutting the sweets out.      Fluid intake:  64 ounces of water, crystal light, unsweetened tea.  0 ounces of soda, 0 ounces of sweet tea, 0 ounces of fruit juice, 0 ounces of caffeine.  Patient is encouraged not to drink calories and stick to non-carbonated, non-caffeine, sugar free drinks.  The goal is 64 ounces of fluid per day.        Physical Activity/Exercise    Comments:     Currently for exercise, patient is going to the gym and working out and walking 2 miles.  Patient was given a list of ideas for activity and was encouraged to incorporate 30 minutes a day into their daily routine.    Behavior Modification       Comments:   In class, we also focused on the behavior aspects of weight management.  This includes being a mindful eater and not eating in front of the TV.  Patient is also encouraged to take 20 minutes to eat a meal and eat at a table.   Patient states they are currently taking 30-60 minutes to eat a meal.    One goal for next month includes:  1.  Work on [employer].       Adela Ports, MS RD  03/25/2021

## 2021-04-28 ENCOUNTER — Inpatient Hospital Stay: Admit: 2021-04-28 | Payer: MEDICAID

## 2021-04-28 NOTE — Progress Notes (Signed)
[facility] [place] [facility]  [address] [facility] [address], [address]    Patient's Name: Monique Rogers   Age: 35 y.o.  Date of Birth: [birth year]   Sex: female    Date:   April 22, 2021    Insurance:            Session: 4 of  6  Revision:   Surgeon:  Dr. Erskine Speed    Height: 5 f 1 Weight:    252      Lbs.   BMI:    Pounds Lost since last month: 0               Pounds Gained since last month: 2    Starting Weight: 253   Previous Month's Weight: 250  Overall Pounds Lost: 1 Overall Pounds Gained: 0      Do you smoke?  None    Alcohol intake:  Number of drinks at a time:  None  Number of times a week: None    Class Guidelines    Guidelines are reviewed with patient at the start of every class.    1. Patient understands that weight loss trial classes must be consecutive.  Patient understands if they miss a class, it is their responsibility to contact me to reschedule class.  I will reach out to patient after their first no show.  2.  Patient understands the expectations that weight maintenance/weight loss is expected during the classes.  Failure to demonstrate changes may result in one extra month of weight loss trial, followed by going back to see the surgeon.  Patient understands that they CAN NOT gain any weight during the weight loss trial.  Gaining weight will result in extra classes.  3. Patient is also instructed to be doing their labs, blood work, psych visit, support group and any other test that the surgeon has used while they are working on their weight loss trial.  4.  Patient was instructed to bring their blue binder to every class and appointment.      Other Pertinent Information:     Changes Made Since Last Class: No meat in 2 weeks.  Looking for other protein options, which I have discussed with her    Eating Habits and Behaviors    Today in class, I reviewed a power point that discussed Nutrition, Behavior, and Exercise changes to start working on.  Some of the eating  behaviors that we discussed included the importance of eating breakfast.  We talked about some of the reasons that people don't eat breakfast and food choices that would be appropriate.    We also talked about avoiding liquid calories.  Patient is encouraged to aim for 64 ounces of fluid per day and trying to get them from water, Crystal Light, sugar free drinks.    We also talked about eating out options that are healthier.  Patient was encouraged to always request sauces and dressings on the side and request a salad, broth-based soup, or vegetables in place of fries.      Patient's current diet habits include: Patient's current diet habits include: Patient is eating 3 meals a day.  Patient states their portion sizes are smaller plate.  I have encouraged patient to use a smaller plate and fill up on water before meals to help cut down on portions.  Patient is eating fast food: none in the last 2 weeks.  Carry out intake is: 0.  Sit down restaurant intake is: not often at all.   Patient's is eating bread, rice, pasta, cereal, and other carbohydrates tries to cut these out.    Patient is snacking on fruit, 100 calorie pack of nuts.  This is being done when she is hungry.  Patient's sweet intake is 0.  I have talked about the importance of getting into the habit now of cutting the sweets out.      Fluid intake:  64 ounces of water, crystal light, unsweetened tea.  0 ounces of soda, 0 ounces of sweet tea, 0 ounces of fruit juice, 0 ounces of caffeine.  Patient is encouraged not to drink calories and stick to non-carbonated, non-caffeine, sugar free drinks.  The goal is 64 ounces of fluid per day.      Physical Activity/Exercise    Comments:    We talked about exercise.  Patient was given reasons of why exercise is so important and how that can help with their long-term success.  I have encouraged patient to get a support system to help with the activity.    Currently for activity, patient is doing 10,000 steps per day.   We have talked about goals for activity to incorporated.      Behavior Modification       Comments:   We also talked about behavior modifications.  We talked about eating triggers, such as eating in front of the TV and solutions, such as making the TV a no eating zone.  If patient is eating out out of emotion, food will only temporarily solve that.  Patient is encouraged to HALT and assess if they are eating because they are Hungry, or out of emotions: Anxious, Lonely, Tired, which the food will only temporarily solve.  We also talked about ways to prevent relapse.    Goals that patient wants to work on includes:  1. Meal prep  2. Food journaling      Adela Ports, MS RD  April 22, 2021

## 2021-05-27 ENCOUNTER — Inpatient Hospital Stay: Admit: 2021-05-27 | Payer: MEDICAID

## 2021-05-27 NOTE — Progress Notes (Signed)
 [facility] [place] [facility]  [address] [facility] [address], [address]    Patient's Name: Monique Rogers   Age: 35 y.o.  Date of Birth: Mar 19, 1986   Sex: female    Date:   05/27/2021    Insurance:              Session: 5 of  6  Revision:     Surgeon:  Dr. Harden    Height: 5 f 1   Weight:    255      Lbs.     BMI:    Pounds Lost since last month: 0                 Pounds Gained since last month: 3    Starting Weight: 253     Previous Month's Weight: 252  Overall Pounds Lost: 0   Overall Pounds Gained: 2      Do you smoke?  None    Alcohol intake:  Number of drinks at a time:  None  Number of times a week: None    Patient has been to a support group.  Class Guidelines    Guidelines are reviewed with patient at the start of every class.    1. Patient understands that weight loss trial classes must be consecutive.  Patient understands if they miss a class, it is their responsibility to contact me to reschedule class.  I will reach out to patient after their first no show.  2.  Patient understands the expectations that weight maintenance/weight loss is expected during the classes.  Failure to demonstrate changes may result in one extra month of weight loss trial, followed by going back to see the surgeon.  Patient understands that they CAN NOT gain any weight during the weight loss trial.  Gaining weight will result in extra classes.  3. Patient is also instructed to be doing their labs, blood work, psych visit, support group and any other test that the surgeon has used while they are working on their weight loss trial.  4.  Patient was instructed to bring their blue binder to every class and appointment.      Other Pertinent Information:     Changes Made Since Last Class:     Eating Habits and Behaviors    Today in class, we started talking about the key diet principles.  We first focused on stopping liquid calories.  Patient was also educated on carbohydrates.  Patient was  instructed to start cutting out bread, rice, and pasta from the diet and start focusing more on meat and vegetables.      I then gave a power point, which focused on Label Reading.  In class, I gave patients a labels and we worked through a series of questions to help patients have a better understanding of label reading.  Patient was instructed to review the serving size.  Patient was encouraged to focus on protein and carbohydrates.  We also did a few label reading activities to help the patient become more familiar with label reading.      Patient's current diet habits include: Patient's current diet habits include: Patient is eating 2-3 meals a day.  Patient states their portion sizes are regular plate, but plans to use a kid's plate.  I have encouraged patient to use a smaller plate and fill up on water before meals to help cut down on portions.  Patient is eating fast food:  0.  Carry out intake is: 0.  Sit down restaurant intake is: 1-2 x a week.   Patient's is eating bread, rice, pasta, cereal, and other carbohydrates states she ate a lot of bread this week, which caused her to gain weight.    Patient is snacking on kind bars, quaky yogurt chewy bars.  This is being done when in class.  Patient's sweet intake is 0.  I have talked about the importance of getting into the habit now of cutting the sweets out.    Fluid intake:  64 ounces of water, crystal light, unsweetened tea.  12 ounces of soda, 0 ounces of sweet tea, 0 ounces of fruit juice, 0 ounces of caffeine.  Patient is encouraged not to drink calories and stick to non-carbonated, non-caffeine, sugar free drinks.  The goal is 64 ounces of fluid per day.        Physical Activity/Exercise    Comments:    We talked about exercise.  Patient was given reasons of why exercise is so important and how that can help with their long-term success.  I have encouraged patient to get a support system to help with the activity.    Currently for activity, patient is  doing walking at least 1 mile per day.      Behavior Modification       Comments:  Behavior modifications were reinforced.  This included not eating in front of the TV, which could lead to bigger portions and eating when one is not hungry.  We also talked about the importance of eating 3 meals per day.  Patient was encouraged to food journal to keep their daily carbohydrates less than 30 grams per meal.      Goals that patient wants to work on includes:  1. Cut out soda.  2.       Jacqueline Browning, MS RD  05/27/2021

## 2021-06-24 ENCOUNTER — Inpatient Hospital Stay: Admit: 2021-06-24 | Payer: MEDICAID

## 2021-06-24 NOTE — Progress Notes (Signed)
 [facility] [place] [facility]  [address] [facility] [address], [address]    Patient's Name: Monique Rogers   Age: 35 y.o.  Date of Birth: 1986/11/14   Sex: female        Insurance:              Session: 6 of  6  Surgeon:  Dr. Harden    Height: 5 f 1   Current Weight:    253      Lbs.     BMI:    Pounds Lost since last month: 2                 Pounds Gained since last month: 0      Starting Weight: 253     Previous Month's Weight: 255  Overall Pounds Lost: 0   Overall Pounds Gained: 0    Do you smoke?  None    Alcohol intake:  Number of drinks at a time:  None  Number of times a week: NOne    Class Guidelines    Guidelines are reviewed with patient at the start of every class.    1. Patient understands that weight loss trial classes must be consecutive.  Patient understands if they miss a class, it is their responsibility to contact me to reschedule class.  I will reach out to patient after their first no show.  2.  Patient understands the expectations that weight maintenance/weight loss is expected during the classes.  Failure to demonstrate changes may result in one extra month of weight loss trial, followed by going back to see the surgeon.  Patient understands that they CAN NOT gain any weight during the weight loss trial.  Gaining weight will result in extra classes.  3. Patient is also instructed to be doing their labs, blood work, psych visit, support group and any other test that the surgeon has used while they are working on their weight loss trial.  4.  Patient was instructed to bring their blue binder to every class and appointment.      Other Pertinent Information:     Changes Made Since Last Class: none    Eating Habits and Behaviors    Today in class, we reviewed the key diet principles.  I have talked to patient about pushing the fluid and working towards 64 ounces per day.  We focused on following a low-calorie diet.  Patient was instructed to count their  carbohydrates and try to keep their daily intake under 75 grams per day and try to keep their daily protein at 80 grams per day.  Patient was given examples of carbohydrates in starches.  Patient was encouraged to focus on meat and vegetables and begin cutting carbohydrates out.  We talked about foods that are protein-based and how to incorporate those into their meals.  I also reviewed with patient the importance of eating 3 meals per day and suggestions were made for breakfast items.    Patient's current diet habits include: Patient is eating 2-3 meals per day.  Meals focus on: mixture of protein, vegetables, and carbohydrates  Patient is encouraged to fill up on protein first, then vegetables, and work on cutting back on the carbohydrates.  Portions are: smaller plate.  Patient is encouraged to use a smaller plate to help cut down on portion sizes.  Patient is eating fast food: not often.  Patient is eating carry out: 0 times a week.  Patient is eating at a sit down restaurant: 0 times a week.  Patient is eating bread, rice, and pasta:  lately she states she may have a small bag of chips while studying.  Patient is snacking on string cheese, fruits, or granola bars.  This is being done few times a day.  Patient is eating sweets: none.  Patient is drinking 64 ounces of water, 0 ounces of soda, 0 ounces of sweet tea, 0 ounces of fruit juices, 0 ounces of caffeine.  Patient is encouraged to continue to cut out liquid calories and aim for achieving 64 ounces of fluid per day.      Physical Activity/Exercise    Comments:    We talked about exercise.  Patient was given reasons of why exercise is so important and how that can help with their long-term success.  I have encouraged patient to get a support system to help with the activity.    For activity, patient is walking a mile daily.  We have talked about goals, which include starting off walking and building upon that.  Patient is also encouraged to add in some light  weights to get some strength training.     Behavior Modification       Comments:  During today's lesson, I gave a presentation called The 100-200 Calorie Mindless Margin.  The goal is to make modest daily 100-200 calorie reductions in certain things that the body won't notice.  One, 100-200 calorie change and would will look 10-20 pounds in one year.  An example could be cutting soda.  Patient was given a check off list and was encouraged to come up with 1-3 100 calories changes they could make.  The check off list is a daily tracker to see if these goals are being met.        Goals that patient wants to work on includes:  1. Decrease carb intake  2. Decrease urge to snack      Monique Schlossman, MS RD  06/24/2021

## 2021-06-24 NOTE — Progress Notes (Signed)
Patient has completed a 6 month weight loss trial.  Patient understands that I will need the following in order to be cleared nutritionally.    -  Complete nutrition assessment    - A weight sent in or come to [facility] to get a weight check.    -  Patient has attended a support group meeting.    Comments:      Jacqueline Browning, MS RD

## 2021-06-24 NOTE — Progress Notes (Signed)
 Formatting of this note might be different from the original.    Patient has completed a 6 month weight loss trial.  Patient understands that I will need the following in order to be cleared nutritionally.    -  Complete nutrition assessment    - A weight sent in or come to [facility] to get a weight check.    -  Patient has attended a support group meeting.    Comments:      Arlyne Schlossman, MS RD    Electronically signed by Browning, Jacqueline D, RD at 06/24/2021  2:23 PM EDT

## 2021-07-20 NOTE — Progress Notes (Signed)
07/20/21:  Patient completed her weight loss trial in August 2022.  I have reached out to her again to remind her what needed to be completed in order to be cleared nutritionally.      Patient has completed a 6 month weight loss trial.  Patient understands that I will need the following in order to be cleared nutritionally.    -  Complete nutrition assessment    - A weight sent in or come to [facility] to get a weight check.    -  Patient has attended a support group meeting.    CommentsAdela Ports, MS RD  07/20/2021

## 2023-06-01 ENCOUNTER — Encounter: Payer: MEDICAID | Attending: Specialist | Primary: Medical

## 2023-06-01 ENCOUNTER — Ambulatory Visit: Admit: 2023-06-01 | Discharge: 2023-06-01 | Payer: MEDICAID | Attending: Specialist | Primary: Medical

## 2023-06-01 NOTE — Progress Notes (Signed)
Bariatric Surgery Consultation    Subjective:     The patient is a 37 y.o. obese female with a Body mass index is 45.91 kg/m.Monique Rogers  The patient is currently her heaviest weight for the past several years.  she has been overweight since age 76.   she has been considering surgery since last year.  she desires surgery at this time because of multiple health concerns and their lifestyle issues which are hindered by their weight. she has been referred by his family physician for evaluation and treatment of their obesity via surgical intervention. Monique Rogers has tried multiple diets in her lifetime most recently tried physician supervised, behavior modification, and unsupervised diets    Bariatric comorbidities present are   Patient Active Problem List   Diagnosis    Chronic low back pain    Morbid obesity (HCC)    Morbid obesity with BMI of 45.0-49.9, adult (HCC)    Asthma       The patient is considering laparoscopic sleeve gastrectomy  for surgical weight loss due to their ineffective progress with medical forms of weight loss and the urging of their physician who cares for their primary medical issues. The patient  now presents  for consideration for weight loss surgery understanding the benefits of this over a medical approach of weight loss as was discussed in our presentation on weight loss surgery. They have discussed their plans both with their family and primary care physician who is in support of their pursuit of such. The patient has had no health issues as of late and denies and gastrointestinal disturbances other than what is outlined below in their review of symptoms. All of their prior evaluations available by both their PCP's and specialists physicians have been reviewed today either in the Care Everywhere portal or scanned under the media tab.    I have spent a large portion of my initial consultation today reviewing the patients current dietary habits which have contributed to their health issues and  obesity.  I have suggested to them personally a dietary regimen that they can initiate now to help with their status as it pertains to their weight.  They understand that the most important aspect of their journey through their weight loss endeavor will be their adherence to a new lifestyle of healthy eating behavior. They also understand that an adherence to an exercise program will not only help with weight loss but is ultimately important in weight maintenance.    The patients goal weight is 155lb.     Patient Active Problem List    Diagnosis Date Noted    Morbid obesity with BMI of 45.0-49.9, adult (HCC) 06/01/2023    Asthma 06/01/2023    Chronic low back pain 01/06/2021    Morbid obesity (HCC) 01/06/2021     Past Medical History:   Diagnosis Date    Arthritis     Asthma     rare    Lumbar pars defect     Morbid obesity (HCC)     Morbid obesity with BMI of 45.0-49.9, adult (HCC)     Skin abscess 06/2020     Past Surgical History:   Procedure Laterality Date    BACK SURGERY      hardware in back 2007    BREAST REDUCTION SURGERY  2017    CESAREAN SECTION         family history is not on file.   Current Outpatient Medications   Medication Sig Dispense Refill  albuterol sulfate HFA (PROVENTIL;VENTOLIN;PROAIR) 108 (90 Base) MCG/ACT inhaler inhale 1 puff by mouth and INTO THE LUNGS every 6 hours if needed for cough or wheezing RESCUE      Bacillus Coagulans-Inulin (PROBIOTIC) 1-250 BILLION-MG CAPS Take by mouth      Cholecalciferol (VITAMIN D3) 50 MCG (2000 UT) CAPS Take 1 capsule by mouth daily      Multiple Vitamins-Minerals (THERAPEUTIC MULTIVITAMIN-MINERALS) tablet Take 1 tablet by mouth daily      levonorgestrel (MIRENA, 52 MG,) IUD 52 mg 1 Device by IntraUTERine route once       No current facility-administered medications for this visit.     Latex and Penicillins     Review of Systems:     General - No history or complaints of unexpected fever, chills, or weight loss  Head/Neck - No history or complaints of  headache, diplopia, dysphagia, hearing loss  Cardiac - No history or complaints of chest pain, palpitations, murmur, or shortness of breath  Pulmonary - No history or complaints of shortness of breath, productive cough, hemoptysis  Gastrointestinal - no reflux, no abdominal pain, obstipation/constipation or blood per rectum  Genitourinary - No history or complaints of hematuria/dysuria, stress urinary incontinence symptoms, or renal lithiasis  Musculoskeletal - back and  joint pain in their  pars surgery,  no muscular weakness  Hematologic - No history or complaints of bleeding disorders,  No blood transfusions  Neurologic - No history or complaints of  migraine headaches, seizure activity, syncopal episodes, TIA or stroke  Integumentary - No history or complaints of rashes, abnormal nevi, skin cancer  Gynecological - normal menses    Objective:     BP 115/71 (Site: Right Upper Arm, Position: Sitting, Cuff Size: Large Adult)   Pulse 64   Temp 97 F (36.1 C)   Resp 16   Ht 1.575 m (5\' 2" )   Wt 113.9 kg (251 lb)   SpO2 99%   BMI 45.91 kg/m     Physical Examination: General appearance - alert, well appearing, and in no distress  Mental status - alert, oriented to person, place, and time  Eyes - pupils equal and reactive, extraocular eye movements intact  Ears - external ear canals normal  Nose - normal and patent, no erythema, discharge or polyps  Mouth - mucous membranes moist, pharynx normal without lesions  Neck - supple, no significant adenopathy  Lymphatics - no palpable lymphadenopathy, no hepatosplenomegaly  Chest - clear to auscultation, no wheezes, rales or rhonchi, symmetric air entry  Heart - normal rate, regular rhythm, normal S1, S2, no murmurs, rubs, clicks or gallops  Abdomen - soft, nontender, nondistended, no masses or organomegaly  Back exam - full range of motion, no tenderness, palpable spasm or pain on motion  Neurological - alert, oriented, normal speech, no focal findings or movement  disorder noted  Musculoskeletal - no joint tenderness, deformity or swelling  Extremities - peripheral pulses normal, no pedal edema, no clubbing or cyanosis  Skin - normal coloration and turgor, no rashes, no suspicious skin lesions noted    Labs:       No results found for this or any previous visit (from the past 1440 hour(s)).    Assessment:     Morbid obesity with comorbidity    Plan:     laparoscopic sleeve gastrectomy    This is a 37 y.o. female with a BMI of Body mass index is 45.91 kg/m. and the weight-related co-morbidties as noted below. Heydy meets  the NIH criteria for bariatric surgery based upon the BMI of Body mass index is 45.91 kg/m. and multiple weight-related co-morbidties. Jacqeline has elected laparoscopic sleeve gastrectomy as her intervention of choice for treatment of morbid obestiy through surgical means secondary to its safety profile, rapid return to work  and decreases in operative risks over gastric bypass.    In the office today, following Chasady's history and physical examination, a 30 minute discussion regarding the anatomic alterations for the laparoscopic sleeve gastrectomy was undertaken. The dietary expectations and the patient and physician dependent factors for success were thoroughly discussed, to include the need for interval follow-up and long-term dietary changes associated with success. The possible complications of the sleeve gastrectomy  were also discussed, to include;death, DVT/PE, staple line leak, bleeding, stricture formation, infection, nutritional deficiencies and sleeve dilation.  Specific weight related outcomes for success were also discussed with an emphasis on careful and close follow-up with the first year and eating behavior modification as the baseline and cyclical hunger return.  The patient expressed an understanding of the above factors, and her questions were answered in their entirety.    In addition, the patient viewed a power point seminar regarding  obesity, surgical weight loss including, adjustable gastric band, gastric bypass, and sleeve gastrectomy.  This discussion contrasted the different surgical techniques, mechanisms of actions and expected outcomes, and surgical and medical risks associated with each procedure.       Today, the patient had all of her questions answered and desires to proceed with  bariatric surgery initially choosing sleeve gastrectomy as her surgical option.    Secondary Diagnoses:     Dietary Intervention  - The patient is currently scheduled to see or has been followed by a bariatric nutritionist for an attempt at preoperative weight loss as has been dictated by their insurance carrier.  They will be assessed at various times during their follow up to evaluate their progress depending on the length of time that is required once again by their carrier.  I have explained the importance of preoperative weight loss and the benefits regarding lower surgical risk and also assisting the patient in reaching their weight loss goal.  Finally they understand their is a physiologic benefit from the standpoint of hepatic volume reduction preoperatively.  I have reiterated the importance of a low carbohydrate and high protein regimen to achieve their stated goal.       Signed By: Crissie Reese, MD     June 01, 2023       Time spent was 50 minutes

## 2023-06-01 NOTE — Patient Instructions (Signed)
New patient Instructions      1. Ensure all pre-operative insurances requirements are complete (ie; dietary visits, psychology consults, primary care documentation, etc)    2. Adhere to pre-operative weight loss / weight maintenance plan discussed in the office today.    3. Contact the office with any questions on pre-operative clearance issues (ie; cardiology work-up, pulmonary work-up, upper GI study, etc).    4. If a barium upper GI study has been ordered for your evaluation, make sure you are on liquids only the morning of the procedure.

## 2023-06-15 ENCOUNTER — Ambulatory Visit: Payer: MEDICAID | Primary: Medical

## 2023-06-15 ENCOUNTER — Inpatient Hospital Stay: Admit: 2023-06-15 | Payer: MEDICAID | Primary: Medical

## 2023-06-15 ENCOUNTER — Ambulatory Visit: Admit: 2023-06-16 | Discharge: 2023-06-16 | Payer: MEDICAID | Attending: Specialist | Primary: Medical

## 2023-06-15 ENCOUNTER — Inpatient Hospital Stay: Admit: 2023-06-15 | Discharge: 2023-06-15 | Payer: MEDICAID | Primary: Medical

## 2023-06-15 ENCOUNTER — Encounter

## 2023-06-15 MED ORDER — BARIUM SULFATE 96 % PO SUSR
96 | Freq: Once | ORAL | Status: AC | PRN
Start: 2023-06-15 — End: 2023-06-15
  Administered 2023-06-15: 19:00:00 100 mL via ORAL

## 2023-06-15 MED FILL — E-Z-PAQUE 96 % PO SUSR: 96 % | ORAL | Qty: 750

## 2023-06-15 NOTE — Progress Notes (Signed)
[facility] UGI FOCUS NOTE      Date:  06/15/2023  Time:  3:10 PM    Patient:  Monique Rogers  Procedure:  UGI      Patient Questions  Lap Band Adjustment Patient Questionnaire:  If female, are you pregnant? [] Yes     [x] No  Tolerates thick liquids:  [x] Well   [] 1     [] 2     [] 3     [] 4     [] 5     [] Poorly  Tolerates red meat:  [x] Well   [] 1     [] 2     [] 3     [] 4     [] 5     []  Poorly  Tolerates chicken:  [x] Well   [] 1     [] 2     [] 3     [] 4     [] 5     [] Poorly  Tolerates fish:   [x] Well   [] 1     [] 2     [] 3     [] 4     [] 5     [] Poorly  Hunger is:   [x] Well Controlled     [] 1     [] 2     [] 3     [] 4     [] 5      []  Poorly Controlled  Nightime regurgitation:  [x] Never     [] 1     [] 2     [] 3     [] 4     [] 5     [] Frequently    Lap Band Info:  Band Type  [] Realize     [] Realize-C     [] AP     [] VG     [] 10cm     [] Unknown  [] Other      Comments:        Fredrik Rigger, RN

## 2023-06-15 NOTE — Procedures (Signed)
Patient:Monique Rogers   DOB: 1986-08-10  Medical Record Number:[id number]            PREPROCEDURE DIAGNOSIS: This patient is preoperative for laparoscopic sleeve gastrectomy procedure with a history of  reflux disease.    POSTPROCEDURE DIAGNOSIS: This patient is preoperative for laparoscopic sleeve gastrectomy procedure with a history of  reflux disease.      PROCEDURES PERFORMED: Upper GI study with barium.    ESTIMATED BLOOD LOSS: None.    SPECIMENS: None.    STATEMENT OF MEDICAL NECESSITY: The patient is a patient with a  longstanding history of obesity. They are now considering the laparoscopic sleeve gastrectomy procedure as a means of surgical weight control and due to their history of reflux disease and are being assessed preoperatively for such.    DESCRIPTION OF PROCEDURE: The patient was brought to the fluoroscopy unit and  was given thin barium. On swallowing of barium, they were noted to have  normal peristalsis of their esophagus. They had prompt filling of distal  esophagus with tapering into the gastroesophageal junction. There was no evidence of a hiatal hernia present. Contrast then filled the gastric cardia, fundus,body and pre pyloric region with no abnormalities noted. Contrast then exited the pylorus in normal fashion. No obstruction was noted. There was no evidence of reflux noted.    (normal anatomy)    Angelique Holm, MD

## 2023-06-21 ENCOUNTER — Inpatient Hospital Stay: Primary: Medical

## 2023-06-29 ENCOUNTER — Encounter: Primary: Medical

## 2023-06-30 ENCOUNTER — Inpatient Hospital Stay: Admit: 2023-06-30 | Discharge: 2023-07-01 | Payer: MEDICAID | Primary: Medical

## 2023-06-30 NOTE — Progress Notes (Incomplete)
Medical Weight Loss Multi-Disciplinary Program    Patient's Name: Monique Rogers Age: 37 y.o.   Date of Birth: Jan 24, 1986 Sex: female      Session #1. Pt attended in-person class.  Weight obtained in office.    Date: 06/30/2023    There were no vitals filed for this visit.   There is no height or weight on file to calculate BMI.     Pounds Lost since last month: N/A   Pounds Gained since last month: N/A    Starting Weight: *** lbs  Previous Month's Weight: N/A  Overall Pounds Lost: *** lbs  Overall Pounds Gained: *** lbs      Class Guidelines    Guidelines are reviewed with patient at the start of every class.    1. Patient understands that weight loss trial classes must be consecutive.  Patient understands if they miss a class, it is their responsibility to contact me to reschedule class.  I will reach out to patient after their first no show.  2.  Patient understands the expectations that weight maintenance/weight loss is expected during the classes.  Failure to demonstrate changes may result in extension of weight loss trial, followed by returning to see the surgeon.  Patient understands that they CANNOT gain any weight during the weight loss trial.  Gaining weight will result in extension of weight loss trial.  3. Patient is also instructed to complete their labwork, psychological evaluation visit, and any other tests that the surgeon has used while they are working on their weight loss trial.  4.  Patient was instructed to bring their packet of nutrition education materials to every class and appointment.        Eating Habits and Behaviors    Today in class we reviewed the Key Diet Principles.  Patient was encouraged to consume 3 meals each day, and the timing the meals was reviewed.  Meal time behaviors that will help pt to be successful with their weight loss efforts were additionally reviewed.      We discussed the importance of drinking adequate amounts of fluids, recommending that patient consume a minimum of  64 oz of sugar-free, caffeine-free and carbonation-free fluids each day.  Patient was encouraged to eliminate sugar-sweetened beverages such as sweet tea, fruit juice, fruit smoothies, flavored coffee drinks and regular sodas.      During the weight loss trial, patient was encouraged to focus on eating protein-forward meals, with a daily protein goal of 80-100 grams.  Patient was also advised to decrease carbohydrate intake to 50-100 g/d or less, choosing limited amounts of complex, high-fiber vs. simple and processed/refined carbohydrates.  We also discussed limiting fat intake.  Encouraged patient to follow the "3-gram rule" when choosing foods by looking for items containing <3 g of fat and sugar per serving.  We reviewed meal planning guidelines and discussed appropriate meal and snack options.  We also talked about protein supplements, and patient was encouraged to start trying these, using them either for a meal replacement or a protein-rich snack up to twice daily before surgery.  We reviewed the nutritional guidelines for selecting protein shakes.      Pre-operative vitamin and mineral supplementation were reviewed.  Patient was instructed to choose a chewable complete multivitamin with iron in NON-gummy form. Selection of probiotic was also reviewed.    Patient's current diet habits include:  Patient is eating *** meals and *** snacks per day.  Pt has found *** protein supplement shakes for use  post-op in meeting their protein needs.    Patient's plan for dietary and/or behavior changes in the upcoming month: ***    Physical Activity/Exercise    Comments:    We talked about exercise.  Patient was given reasons of why exercise is so important and how that can help with their long-term success.  I have encouraged patient to get a support system to help with the activity.  Recommended that pt aim for 150 min/wk moderate to vigorous physical activity.    We talked about recommended types of physical activity,  including walking, swimming, cycling, or chair exercises.  I also talked with patient about doing some strength training, which helps the metabolism, as well as strengthen muscles and bones.  Patient's plan to incorporate more activity includes: ***      Behavior Modification     Comments:  During today's class, we discussed mindful eating as a behavior change tool to improve eating behaviors, promote weight loss and long-term weight maintenance, encourage a healthy relationship with food, and prevent chronic disease.  Patient was directed to the handouts on mindful eating that were provided in class.  We discussed the acronym HALT, which is a reminder to promote eating only when physically hungry and not as a way of dealing with difficult emotions, fatigue or other life stressors.      Goals:   Work to increase to 3-4 small meals per day, with 1-2 planned snacks as needed.  Recommend following the plate method for meal planning - focusing on lean protein, non-starchy vegetables, and measured amounts of complex carbohydrates.   - Goal of 80-100 g protein and <100 g carbohydrates per day.               - Select at least 2 DIFFERENT protein supplements that can be used for protein supplementation to meet goals pre- and post-operatively.   - Practice Carbohydrate Counting and label reading.   - Follow 3 g rule for fat and sugar.  2. Slow down meals  - Chew each bite 25-35 times.  - Aim for 20-30 min/meal.  3. Increase non-caloric fluids to 64 oz per day.  Eliminate caffeine, added sugar, carbonation, and straws.               - Continue to work to decrease sugar sweetened beverages - goal of calorie-free beverages only.              - Must eliminate caffeine prior to surgery and avoid for ~6-8 weeks.   - Practice 30:30 rule, separating food and fluid intake.  4. Start activity regimen, work to increase ADLs.  5. Start Complete MVI and probiotic at least 30 days pre-op.    Diet Issues:  ***

## 2023-07-26 ENCOUNTER — Inpatient Hospital Stay: Payer: MEDICAID | Primary: Medical

## 2023-07-26 ENCOUNTER — Encounter: Payer: MEDICAID | Primary: Medical

## 2023-07-28 ENCOUNTER — Encounter: Payer: MEDICAID | Primary: Medical

## 2023-08-04 ENCOUNTER — Inpatient Hospital Stay: Admit: 2023-08-04 | Discharge: 2023-08-04 | Payer: MEDICAID | Primary: Medical

## 2023-08-04 ENCOUNTER — Ambulatory Visit: Admit: 2023-08-04 | Discharge: 2023-08-04 | Payer: MEDICAID | Attending: Family | Primary: Medical

## 2023-08-04 ENCOUNTER — Encounter: Payer: MEDICAID | Primary: Medical

## 2023-08-04 ENCOUNTER — Encounter

## 2023-08-04 NOTE — Progress Notes (Addendum)
 Medical Weight Loss Multi-Disciplinary Program    Patient's Name: Monique Rogers Age: 37 y.o.   Date of Birth: 06/05/1986 Sex: female     Session #2. Pt attended in-person class.  Weight obtained in office.    Date: 08/04/2023    Vitals:    08/04/23 1533   Weight: 113.9 kg (251 lb)   Height: 1.575 m (5' 2)      Body mass index is 45.91 kg/m.     Pounds Lost since last month: 2.5 lbs Pounds Gained since last month: 0.0 lbs       Starting Weight: 250 lbs Previous Month's Weight: 253.5 lbs   Overall Pounds Lost: 0.0 lbs  Overall Pounds Gained: 1 lbs      Class Guidelines    Guidelines are reviewed with patient at the start of every class.    1. Patient understands that weight loss trial classes must be consecutive.  Patient understands if they miss a class, it is their responsibility to contact me to reschedule class.  I will reach out to patient after their first no show.  2.  Patient understands the expectations that weight maintenance/weight loss is expected during the classes.  Failure to demonstrate changes may result in extension of weight loss trial, followed by returning to see the surgeon.  Patient understands that they CANNOT gain any weight during the weight loss trial.  Gaining weight will result in extension of weight loss trial.  3. Patient is also instructed to complete their labwork, psychological evaluation visit, and any other tests that the surgeon has ordered while they are working on their weight loss trial.  4.  Patient was instructed to bring their packet of nutrition education materials to every class and appointment.        Eating Habits and Behaviors    Reviewed intake  Understanding low carbohydrates, low sugar, higher protein meals  Instruction given for personal dietary changes  Discussed perceived compliance    Today in class we reviewed the Key Diet Principles.  Patient was encouraged to consume 3 meals each day, and meal timing was reviewed.  Meal time behaviors that will help pt to be  successful with their weight loss efforts were also discussed.      We discussed the importance of drinking adequate amounts of fluids, recommending that patient consume a minimum of 64 oz of sugar-free, caffeine-free and carbonation-free fluids each day.  Patient was encouraged to eliminate sugar-sweetened beverages such as sweet tea, fruit juice, fruit smoothies, flavored coffee drinks and regular sodas.      During the weight loss trial, patient is encouraged to focus on eating protein-forward meals, with a daily goal of 80-100 grams protein.  Patient was also advised to decrease carbohydrate intake to <100 g/d, choosing complex vs. simple carbohydrates in limited amounts.  We also discussed limiting fat intake.  Encouraged patient to follow the 3-gram rule when choosing foods by looking for items containing <3 g of fat and sugar per serving.  We reviewed meal planning guidelines and discussed appropriate meal and snack options.  We also talked about appropriate protein drinks and patient was encouraged to start trying these, using them either for a meal replacement or a protein-rich snack pre-operatively.  We reviewed the nutritional guidelines for selecting protein shakes.      Pre-operative vitamin and mineral supplementation was reviewed.  Patient was instructed to choose a chewable complete multivitamin with iron in NON-gummy form. Selection of probiotic was also reviewed.  Comments: During today's class we talked about the role of carbohydrates. Patient was instructed on:  The function of carbohydrates.  Foods that are carbohydrate-heavy.  Patient was given the guidelines to keep their carbohydrates less than 50-75 grams per day in the pre-op phase.  Patient was also given ideas of low carb swaps, which include zucchini noodles, spaghetti squash, or cauliflower rice.   Discussed lower carb swaps to use instead of potato chips.      Patient's current diet habits include:  Patient is eating 2 meals and  2-3 snacks per day.    Pt has found 1 protein supplement shakes for use post-op in meeting their protein needs    Patient's plan for dietary and/or behavior changes in the upcoming month: NONE NOTED.     Physical Activity/Exercise    Comments:  We talked about exercise.  Patient was given reasons of why exercise is so important and how that can help with their long-term success.  I have encouraged patient to get a support system to help with the activity.    We talked about recommended types of physical activity, including walking, swimming, cycling, or chair exercises.  I also talked with patient about doing some strength training, which helps the metabolism, as well as strengthen muscles and bones.  Patient's plan to incorporate more activity includes: Walking more times during the week. When I wake up in the morning, go ahead and work out to get my 30 minutes 5 x per week.       Behavior Modification     Comments:  We discussed how eating highly palatable processed foods high in simple sugars and refined carbohydrates boost serotonin and dopamine, the body's feel-good hormones, and because the body uses digested carbohydrates in the form of glucose, for energy, the body does in fact crave carbohydrates.  However, learned behaviors/habits can be mistaken for carb cravings.  By modifying food choices and eating behaviors, including a focus on lean proteins, non-starchy vegetables, and limited amounts of other high-fiber complex carbohydrates, as well as eating 5-6 small/frequent meals/snacks daily, we can eliminate these cravings, lose weight, and achieve optimal health.      Goals:   Work to increase to 3-4 small meals per day, with 1-2 planned snacks as needed.  Recommend following the plate method for meal planning - focusing on lean protein, non-starchy vegetables, and measured amounts of complex carbohydrates.   - Goal of 80-100 g protein and <100 g carbohydrates per day.               - Select at least 2  DIFFERENT protein supplements that can be used for protein supplementation to meet goals pre- and post-operatively.   - Practice Carbohydrate Counting and label reading.   - Follow 3 g rule for fat and sugar.  2. Slow down meals  - Chew each bite 25-35 times.  - Aim for 20-30 min/meal.  3. Increase non-caloric fluids to 64 oz per day.  Eliminate caffeine, added sugar, carbonation, and straws.               - Continue to work to decrease sugar sweetened beverages - goal of calorie-free beverages only.              - Must eliminate caffeine prior to surgery and avoid for ~6-8 weeks.   - Practice 30:30 rule, separating food and fluid intake.  4. Start activity regimen, work to increase ADLs.  5. Start Complete MVI and probiotic  at least 30 days pre-op.

## 2023-08-04 NOTE — Progress Notes (Signed)
 Pre-Operative Progress Consultation  Original Consult in July 2024 at 251 lbs    Subjective:     Monique Rogers is a 37 y.o. obese female with a Body mass index is 45.76 kg/m.Monique  Monique Rogers desires surgery at this time because of health issues and quality of life issues.  Monique Rogers has been seen by a bariatric nutritionist and has been placed on an appropriate low carbohydrate diet. The patient desires laparoscopic sleeve gastrectomy for surgical weight loss, however she is not currently a surgical candidate due to pending work up.  Monique Rogers is here today to check progress with weight loss / evaluate nutritional status and review all subspecialty clearances in hopes of proceeding to the operating room.     Office visit notes from July 2024 to present have been reviewed.  Monique Rogers has increased fluid intake, is focusing on protein, is eating regularly, is taking a multivitamin & has increased her activity.    No recent visits with PCP.  No new medications.    Patient Active Problem List    Diagnosis Date Noted    Morbid obesity with BMI of 45.0-49.9, adult (HCC) 06/01/2023    Asthma 06/01/2023    Chronic low back pain 01/06/2021    Morbid obesity (HCC) 01/06/2021      Past Medical History:   Diagnosis Date    Arthritis     Asthma     rare    Lumbar pars defect     Morbid obesity (HCC)     Morbid obesity with BMI of 45.0-49.9, adult (HCC)     Skin abscess 06/2020     Past Surgical History:   Procedure Laterality Date    BACK SURGERY      hardware in back 2007    BREAST REDUCTION SURGERY  2017    CESAREAN SECTION        family history is not on file.   Current Outpatient Medications   Medication Sig Dispense Refill    Bacillus Coagulans-Inulin (PROBIOTIC) 1-250 BILLION-MG CAPS Take by mouth      Cholecalciferol (VITAMIN D3) 50 MCG (2000 UT) CAPS Take 1 capsule by mouth daily      Multiple Vitamins-Minerals (THERAPEUTIC MULTIVITAMIN-MINERALS) tablet Take 1 tablet by mouth daily      levonorgestrel (MIRENA,  52 MG,) IUD 52 mg 1 Device by IntraUTERine route once       No current facility-administered medications for this visit.     Latex and Penicillins       Review of Systems:        General - No history or complaints of unexpected fever, chills, or weight loss  Head/Neck - No history or complaints of headache, diplopia, dysphagia, hearing loss  Cardiac - No history or complaints of chest pain, palpitations, murmur, or shortness of breath  Pulmonary - No history or complaints of shortness of breath, productive cough, hemoptysis  Gastrointestinal - No reflux,  abdominal pain, obstipation/constipation, blood per rectum  Genitourinary - No history or complaints of hematuria/dysuria, stress urinary incontinence symptoms, or renal lithiasis  Musculoskeletal - has back pain, no muscular weakness  Hematologic - No history or complaints of bleeding disorders, blood transfusions, sickle cell anemia  Neurologic - No history or complaints of  migraine headaches, seizure activity, syncopal episodes, TIA or stroke  Integumentary - No history or complaints of rashes, abnormal nevi, skin cancer  Gynecological - No history of heavy menses/abnormal menses    Objective:     BP  135/76 (Site: Right Upper Arm, Position: Sitting, Cuff Size: Large Adult)   Pulse 68   Temp 97.3 F (36.3 C)   Ht 1.575 m (5' 2)   Wt 113.5 kg (250 lb 3.2 oz)   SpO2 100%   BMI 45.76 kg/m     Physical Exam:    General appearance:  alert, cooperative, no distress, appears stated age   Mental status   alert, oriented to person, place, and time   Neck  supple, no significant adenopathy     Lymphatics  no palpable lymphadenopathy, no hepatosplenomegaly   Chest  clear to auscultation, no wheezes, rales or rhonchi, symmetric air entry   Heart  normal rate, regular rhythm, normal S1, S2, no murmurs, rubs, clicks or gallops    Abdomen: soft, nontender, nondistended, no masses or organomegaly   Neurological  alert, oriented, normal speech, no focal findings or  movement disorder noted   Musculoskeletal no joint tenderness, deformity or swelling   Extremities peripheral pulses normal, no pedal edema, no clubbing or cyanosis   Skin normal coloration and turgor, no rashes, no suspicious skin lesions noted        Labs:     No results found for this or any previous visit (from the past 2016 hour(s)).        Assessment:     Morbid obesity with associated comorbidity, severe central obesity & outstanding insurance requirements.    Plan:       To continue current medications & routine follow-up with PCP.    Continuation of Pre-Operative evaluation / clearance:  Monique Rogers has returned to the office today to discuss her status as a surgical candidate.  Progress has been noted and reviewed - including review of notes from dietician. Monique Rogers is being compliant with follow-up & recommendations.      Monique Rogers has 0 more pounds to lose before proceeding to the OR.  (1 pounds lost since last visit)  Monique Rogers has an appointment with our dietician today & then will have 3 more nutritional visits to complete before proceeding to the OR.  Additionally, 1 more medical visits are required.  Monique Rogers has an outstanding PCP and psychological clearance to review before proceeding to the OR.  Monique Rogers will return in 1 month to continue the pre-operative process and to work towards goals as outlined.      Monique Rogers understand the rationales for all the above and plans to follow the diet & activity recommendations of the dietician.  It has been discussed that given her morbidly obese condition that the best surgical option for this patient would be the laparoscopic sleeve gastrectomy.  Monique Rogers agrees with the surgical choice and has been educated in it's; risks, benefits, and alternatives.  We will continue with the pre-operative evaluation as needed to check progress.    Had UGI done 06/15/23 showing normal anatomy.    Has completed 3 of 6 nutritional  sessions.    Aware of pre-surgery consult labs placed today.      Secondary Diagnoses:     Dietary Intervention  - The patient is currently followed by a bariatric nutritionist for an attempt at preoperative weight loss as has been dictated by their insurance carrier.  They will be assessed at various times during their follow up to evaluate their progress depending on the length of time that is required once again by their carrier.  I have explained the importance of preoperative weight loss and the  benefits regarding lower surgical risk and also assisting the patient in reaching their weight loss goal.  Finally they understand there is a physiologic benefit from the standpoint of hepatic volume reduction preoperatively.  I have reiterated the importance of a low carbohydrate and high protein regimen to achieve their stated goal.     Restrictive Airway Disease - We will continue all of their pulmonary medications in the form of oral pills and inhalers in both the perioperative and postoperative period. They understand that their symptoms should improve with weight loss. Any further testing related to this will be turned over to their family physician or pulmonologist. The patient understands that if they require oral or IV steroids in the future that they will notify us .  This is particularly important for gastric bypass patients at all times and both sleeve gastrectomy and gastric bypass patients in the 1 month pre op and 1 month post operative period. They understand that inhaled steroids are exempt from this.    Weight Related Arthritis -The patient understands the benefits that weight loss surgery can have on their arthritis but also understands that weight loss is not a guaranteed cure and relief of symptoms is often dependent on the severity of the underlying disease.  The patient also understands that traditional pharmaceutical treatments for this diagnosis are usually unavailable to post-operative weight  loss patients due to the effects on the gastrointestinal tract.  Any changes to the patient's medication treatment will ultimately be made the patient's PCP with input by our office.    Monique Rogers has a reminder for a due or due soon health maintenance. I have asked that she contact her primary care provider for follow-up on this health maintenance.      Signed By: Burnard MALVA Flight, APRN - NP     August 04, 2023

## 2023-08-10 ENCOUNTER — Encounter: Payer: MEDICAID | Primary: Medical

## 2023-08-10 NOTE — Progress Notes (Addendum)
 Patient's Name: Erian Lariviere   Age: 37 y.o.  Date of Birth: December 13, 1985   Sex: female    Date:   08/10/2023              Session: 3 of  8     Vitals:    08/10/23 1012   Weight: 113.7 kg (250 lb 9.6 oz)   Height: 1.575 m (5' 2)      Body mass index is 45.84 kg/m.       Pounds Lost since last visit: 0.4                Pounds Gained since last visit: 0.0  Starting weight: 250 lbs              Last visit weight: 251 lbs    Overall Pounds Lost: 0.0   Overall Pounds Gained: 0.6  **Weight was verified through photographic evidence.    Class Guidelines    Guidelines are reviewed with patient at the start of every class.    1. Patient understands that weight loss trial classes must be consecutive.  Patient understands if they miss a class, it is their responsibility to contact me to reschedule class.  I will reach out to patient after their first no show.  2.  Patient understands the expectations that weight maintenance/weight loss is expected during the classes.  Failure to demonstrate changes may result in one extra month of weight loss trial, followed by going back to see the surgeon.    3. Patient is also instructed to be doing their labs, blood work, psych visit, support group and any other test that the surgeon has used while they are working on their weight loss trial.   Patient understands that they CAN NOT gain any weight during the weight loss trial.  Gaining weight will result in extra classes    Other Pertinent Information: Pt completed nutrition questionnaire on progress on past nutrition/exercise goals and planned goals to work on for next month.  Responses below:  How will you use the information that you viewed in the presentation today? This information was just what I needed. Prior to this video I was sitting down with my weight loss booklet, and attempting to meal plan, but not overwhelm myself. I wrote down some key tips that should help with grocery shopping and meal planning.   Rate your progress on a  nutrition goal you set for yourself this month (you can grade yourself or give % progress, for example: Give self a B or 80% progress). I give myself a 75%, I ate fast food for lunch on yesterday however, and I did choose healthier choices. I ate [employer]; Grilled chicken strips (3 count), side salad, dressing on the side. Better choices but one of the goals I am trying to exceed is to not eat fast food at all.   What has or went well with achieving your goal? Drinking at least 64 oz. of water. I have managed to do that every day, if I get tired of just plain water, I purchased crystal light packets and they help!   What barriers or obstacles did you encounter? I am running into an issue with meal planning. 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   What could you do differently next time to help overcome your barriers/obstacles? Don't give my family too many choices,  once I meal plan, I need to cook the food, and what we have for dinner is what we have! Keeping it as simple and healthy as possible.     Eating Habits and Behaviors    This was a virtual nutrition education 2' COVID-19 restrictions and to ensure that the pt is able to complete their WLT in a timely manner.  Behavior modifications were reinforced.  This included the importance of eating 3 meals per day, as well as limiting eating away from home.    Pt watched a recorded nutrition video talking about the key diet principles. These principles include:  cutting out liquid calories and focusing on water or other non-calorie, non-carbonated drinks.    We also spent time talking about carbohydrates, including foods that have carbohydrates and the goal to keep daily carbohydrates under 50-75 grams per day during their WLT.    Patient was instructed to continue to cut out carbohydrates - grains, foods made from grains, starchy vegetables, and  fruit from their diet and start focusing more on lean protein and  non-starchy vegetables.  Patient was encouraged to start trying protein shakes and was given a list of suggestions.  Vitamin supplementation pre-op was reviewed.    Pt viewed a power point presentation focused on Healthy Eating the Bariatric-Way: Starting  your day right and the benefits of Exercise   Reviewed:  The importance of protein forward meals and snacks.  Selection of lean proteins  Limiting Fat intake, both in food selection and in food preparation.  Limiting carbohydrate intake, by eliminating or limiting the main sources of carbohydrates in the diet.  Choosing appropriate fluids.  The importance of breakfast and healthy breakfast options were reviewed, as well as those to limit or avoid post-op.    Physical Activity/Exercise    Comments:    We talked about exercise.  Patient was given reasons of why exercise is so important and how that can help with their long-term success.  I have encouraged patient to get a support system to help with the activity. Patient was also instructed to work towards 30 minutes of activity per day    Power point presentation reviewed the following regarding exercise:    Exercise guidelines for weight loss and maintenance were reviewed.  The top Five reasons to walk were reviewed including that walking does not require any special skills or equipment, it is a great activity for weight loss, you can break up your walks throughout your day, walking reduces blood pressure and increases HDL-cholesterol, and that studies show that walking can help with depression/the blues.  Exercise can increase your metabolism, and continue burning calories after the exercise session is complete  Over time exercise helps to reduce body fat and increase lean body mass/muscle.  The use of strength training 2-3 days per week to assist even further with the increase of lean body mass and reduction of fat mass.   Any exercise is better  than none - pt was encouraged to start where they are today and increase their activity Q week to meet their goal of 150-300 min cardiovascular activity per week.  Making exercise a priority was stressed, as well as having an accountability partner and exercise goal setting were reviewed.      SMART Goals for the next month were set:  DIET: Eating Habits: I will eat more lean proteins; I purchased some tuna packs but in different flavors to help me get acclimated to eating it.   BEHAVIOR:  Meal Planning: I will plan and journal my food intake for a solid week.  EXERCISE: I will walk for 10 minutes 3 x a day; while I am at work and have some down time, walking to the other side of the building can help me achieve that.       RD: Arland Dean, MS/MCD, RD

## 2023-08-23 ENCOUNTER — Inpatient Hospital Stay: Admit: 2023-08-23 | Discharge: 2023-08-23 | Payer: MEDICAID | Primary: Medical

## 2023-08-23 NOTE — Progress Notes (Signed)
Medical Weight Loss Multi-Disciplinary Program    Patient's Name: Lonisha Bobby Age: 37 y.o.   Date of Birth: [birth year] Sex: female     Session #4. Pt attended in-person class.  Weight obtained in office.    Date: 08/23/2023    Vitals:    08/23/23 1441   Weight: 115 kg (253 lb 8 oz)   Height: 1.575 m (5\' 2" )      Body mass index is 46.37 kg/m.     Pounds Lost since last month: 0.0 lbs Pounds Gained since last month: 2.5 lbs       Starting Weight: 251 lbs Previous Month's Weight: 251 lbs   Overall Pounds Lost: 0.0 lbs  Overall Pounds Gained: 2.5 lbs     Class Guidelines    Guidelines are reviewed with patient at the start of every class.    1. Patient understands that weight loss trial classes must be consecutive.  Patient understands if they miss a class, it is their responsibility to contact me to reschedule class.  I will reach out to patient after their first no show.  2.  Patient understands the expectations that weight maintenance/weight loss is expected during the classes.  Failure to demonstrate changes may result in extension of weight loss trial, followed by returning to see the surgeon.  Patient understands that they CANNOT gain any weight during the weight loss trial.  Gaining weight will result in extension of weight loss trial.  3. Patient is also instructed to complete their labwork, psychological evaluation visit, and any other tests that the surgeon has ordered while they are working on their weight loss trial.  4.  Patient was instructed to bring their packet of nutrition education materials to every class and appointment.        Eating Habits and Behaviors    Reviewed intake  Understanding low-carbohydrate/low-sugar/low-fat, higher protein meals  Instruction given for personal dietary changes  Discussed perceived compliance    Comments: RD Reviewed Diet History and Physical Activity/Exercise habits.  Recommended dietary changes discussed for both before and after surgery.     Today in class we  reviewed the Key Diet Principles.  Patient was encouraged to consume 3 meals each day, and meal timing was reviewed.  Meal time behaviors that will help pt to be successful with their weight loss efforts were also discussed.      We discussed the importance of drinking adequate amounts of fluids, recommending that patient consume a minimum of 64 oz of sugar-free, caffeine-free and carbonation-free fluids each day.  Patient was encouraged to eliminate sugar-sweetened beverages such as sweet tea, fruit juice, fruit smoothies, flavored coffee drinks and regular sodas.      During the weight loss trial, patient is encouraged to focus on eating protein-forward meals, with a daily goal of 80-100 grams protein.  Patient was also advised to decrease carbohydrate intake to <100 g/d, choosing complex vs. simple carbohydrates in limited amounts.  We also discussed limiting fat intake.  Encouraged patient to follow the "3-gram rule" when choosing foods by looking for items containing <3 g of fat and sugar per serving.  We reviewed meal planning guidelines and discussed appropriate meal and snack options.  We also talked about appropriate protein drinks and patient was encouraged to start trying these, using them either for a meal replacement or a protein-rich snack pre-operatively.  We reviewed the nutritional guidelines for selecting protein shakes.      Pre-operative vitamin and mineral supplementation was reviewed.  Patient was instructed to choose a chewable complete multivitamin with iron in NON-gummy form. Selection of probiotic was also reviewed.    We also talked about dining out after bariatric surgery. Patient was instructed on:  Following the Key Diet Principles to stay within the bariatric dietary guidelines  Key words to look for in menu item descriptions in order to make healthier choices  Requesting specific substitutions to allow meals to fit in with bariatric dietary guidelines  Using the restaurant card to  request smaller portions of menu items or ordering from children's/senior's menu    Patient's current diet habits include:  Patient is eating 2-3 meals and 2-3 snacks per day.  Pt has found 2 protein supplement shakes for use post-op in meeting their protein needs.    Patient's plan for dietary and/or behavior changes in the upcoming month: "Meal planning; plan ahead to avoid skipping meals."    Physical Activity/Exercise    Comments:  We talked about exercise.  Patient was given reasons of why exercise is so important and how that can help with their long-term success.  I have encouraged patient to get a support system to help with the activity.    We talked about recommended types of physical activity, including walking, swimming, cycling, or chair exercises.  I also talked with patient about doing some strength training, which helps the metabolism, as well as strengthen muscles and bones.  Patient's plan to incorporate more activity includes: "Increase my exercise days to 5 x a week instead of 2-3 x a week."      Behavior Modification     Comments:  During today's class, we discussed important behavior changes relating to dining away from home that will help the patient be successful in meeting their weight loss goals and improving their health including:  The need to limit the frequency of dining away from home, especially fast food and convenience food options, in order to limit intake of calories, fats, and carbohydrates  The importance of keeping a positive mindset by focusing on the occasion and enjoying the company rather than the foods they can't have and the need for reduced portion sizes after surgery  Avoiding starters such as appetizers, breads, bar snacks, soups, and salads  Researching to find restaurants that offer healthier food options and reviewing menus/nutrition facts online to have a plan of what to order before dining out    Goals:   Work to increase to 3-4 small meals per day, with 1-2  planned snacks as needed.  Recommend following the plate method for meal planning - focusing on lean protein, non-starchy vegetables, and measured amounts of complex carbohydrates.   - Goal of 80-100 g protein and <100 g carbohydrates per day.               - Select at least 2 DIFFERENT protein supplements that can be used for protein supplementation to meet goals pre- and post-operatively.   - Practice Carbohydrate Counting and label reading.   - Follow 3 g rule for fat and sugar.  2. Slow down meals  - Chew each bite 25-35 times.  - Aim for 20-30 min/meal.  3. Increase non-caloric fluids to 64 oz per day.  Eliminate caffeine, added sugar, carbonation, and straws.               - Continue to work to decrease sugar sweetened beverages - goal of calorie-free beverages only.              -  Must eliminate caffeine prior to surgery and avoid for ~6-8 weeks.   - Practice 30:30 rule, separating food and fluid intake.  4. Start activity regimen, work to increase ADLs.  5. Start Complete MVI and probiotic at least 30 days pre-op.

## 2023-09-01 NOTE — Progress Notes (Signed)
Behavior Modification Intervention    Patient's Name: Monique Rogers   Age: 37 y.o.  Date of Birth: [birth year]   Sex: female    Date:   09/01/2023              Session: 5 of  8    Ht: 62"  Wt: 249 lbs  BMI: 46 kg/m2    Pt watched pre-recorded class in accordance with COVID-19 pandemic protocols.  Weight was stated and verified through photographic evidence of pts home weight scale.      Pounds Lost since last virtual visit: 1.6 lbs                Pounds Gained since last visit: 0.0 lbs  Starting weight: 251 lbs              Last virtual visit weight: 250.6 lbs    Overall Pounds Lost: 2 lbs   Overall Pounds Gained: 0.0 lbs    Class Guidelines    Guidelines are reviewed with patient at the start of every class.    1. Patient understands that weight loss trial classes must be consecutive.  Patient understands if they miss a class, it is their responsibility to contact me to reschedule class.  I will reach out to patient after their first no show.  2.  Patient understands the expectations that weight maintenance/weight loss is expected during the classes.  Failure to demonstrate changes may result in one extra month of weight loss trial, followed by going back to see the surgeon.    3. Patient is also instructed to be doing their labs, blood work, psych visit, support group and any other test that the surgeon has used while they are working on their weight loss trial.   Patient understands that they CAN NOT gain any weight during the weight loss trial.  Gaining weight will result in extra classes    Other Pertinent Information: Pt completed nutrition questionnaire on progress on past nutrition/exercise goals and planned goals to work on for next month.  Responses below:    Rate your progress on the nutrition goal you set for yourself this month.  B-  What has or went well with achieving your goal? I am consuming at least 64 oz of water each day, sometimes more. The crystal light packs help very well.   What barriers or  obstacles are you or did you encounter? Because I am always on the go, I eat out. I try really hard to make better choices as far as what I order but I feel convicted for eating out.  If I go to chick fil a; I order a kids meal with grilled nuggets and substitute the fries for a kale or side salad.   What could you do differently next time to help overcome your barriers? I need help with a grocery list however, I did purchase salmon, chicken breast and tuna packs. More non starchy vegetables. I bought cauliflower rice and mash, asparagus, spinach, and brussels sprouts.       Behavior Modification Intervention    Each class begins talking about the key diet principles. These principles include:  cutting out liquid calories and focusing on water or other non-calorie, non-carbonated drinks.    We also spent time talking about carbohydrates, including foods that have carbohydrates and the goal to keep daily carbohydrates under 75 grams per day.    Patient was given ideas of meal and snack choices that are lower in carbohydrates  and focus more on protein.    Patient was encouraged to start trying protein shakes and was given a list of suggestions.    The main topic of class today was: Portion Control.       These tips include:  Using smaller plates/utensils,   Placing all but one (1) portion in a to-go box at a restaurant before starting to eat,   Not eating from the container, but rather portioning snacks into smaller bags.   Filling up on vegetables so that you do not overeat carbohydrates.     Patient's were encouraged to food journal, which helps increase awareness of what and how much they are eating.      The importance of reading labels and portion sizes was stressed, as well as applying these portion sizes.  Patient was given a list of items that can help to make portion control easier.  For example, a deck of cards or a palm of a hand is a proper portion of meat, a fist is a cup or a proper serving of  vegetables.  Patient was given 10 tips to help with the portion control.    Comments:   Pt set goals for next month include:  FOR DIET/NUTRITION: "The goal this week is to meal plan. I would like to have a grocery list that I can go by when shopping. But meal prep/plan every Sunday morning for a month. When prepping, be sure to weigh my food so that I can have the correct portion's. "  FOR BEHAVIOR: "Limit my eating out to 2 days every week. (Only on one day during the week and one day on the weekend)."  FOR ACTIVITY/EXERCISE: "Walking at least 30 minutes to an hour each day, the goal is to reach 2-3 miles a day. I plan to take the stairs at work only for a month".    Next [facility] class is scheduled.  RD is available, and pt has been provided RD contact information, for diet questions or concerns prior to that time.    RD: Monique Sidle Sophiah Rolin, MS/MCD, RD

## 2023-09-20 ENCOUNTER — Inpatient Hospital Stay: Admit: 2023-09-20 | Discharge: 2023-09-20 | Payer: MEDICAID | Primary: Medical

## 2023-09-20 NOTE — Progress Notes (Signed)
Medical Weight Loss Multi-Disciplinary Program    Patient's Name: Posey Jasmin Age: 37 y.o.   Date of Birth: 07/14/1986 Sex: female     Session #6. Pt attended in-person class.  Weight obtained in office.    Date: 09/20/2023    Vitals:    09/20/23 1634   Weight: 114.3 kg (252 lb)   Height: 1.575 m (5\' 2" )      Body mass index is 46.09 kg/m.     Pounds Lost since last month: 1.5 lbs Pounds Gained since last month: 0.0 lbs       Starting Weight: 251 lbs Previous Month's Weight: 253.5 lbs   Overall Pounds Lost: 0.0 lbs  Overall Pounds Gained: 1 lbs       Class Guidelines    Guidelines are reviewed with patient at the start of every class.    1. Patient understands that weight loss trial classes must be consecutive.  Patient understands if they miss a class, it is their responsibility to contact me to reschedule class.  I will reach out to patient after their first no show.  2.  Patient understands the expectations that weight maintenance/weight loss is expected during the classes.  Failure to demonstrate changes may result in extension of weight loss trial, followed by returning to see the surgeon.  Patient understands that they CANNOT gain any weight during the weight loss trial.  Gaining weight will result in extension of weight loss trial.  3. Patient is also instructed to complete their labwork, psychological evaluation visit, and any other tests that the surgeon has ordered while they are working on their weight loss trial.  4.  Patient was instructed to bring their packet of nutrition education materials to every class and appointment.        Eating Habits and Behaviors    Reviewed intake  Understanding low carbohydrates, low sugar, higher protein meals  Instruction given for personal dietary changes  Discussed perceived compliance    Comments: RD Reviewed Diet History and Physical Activity/Exercise habits.  Recommended dietary changes discussed for both before and after surgery.     Today in class we reviewed  the Key Diet Principles.  Patient was encouraged to consume 3 meals each day, and meal timing was reviewed.  Meal time behaviors that will help pt to be successful with their weight loss efforts were also discussed.      We discussed the importance of drinking adequate amounts of fluids, recommending that patient consume a minimum of 64 oz of sugar-free, caffeine-free and carbonation-free fluids each day.  Patient was encouraged to eliminate sugar-sweetened beverages such as sweet tea, fruit juice, fruit smoothies, flavored coffee drinks and regular sodas.      During the weight loss trial, patient is encouraged to focus on eating protein-forward meals, with a daily goal of 80-100 grams protein.  Patient was also advised to decrease carbohydrate intake to <100 g/d, choosing complex vs. simple carbohydrates in limited amounts.  We also discussed limiting fat intake.  Encouraged patient to follow the "3-gram rule" when choosing foods by looking for items containing <3 g of fat and sugar per serving.  We reviewed meal planning guidelines and discussed appropriate meal and snack options.  We also talked about appropriate protein drinks and patient was encouraged to start trying these, using them either for a meal replacement or a protein-rich snack pre-operatively.  We reviewed the nutritional guidelines for selecting protein shakes.      Pre-operative vitamin and mineral supplementation  was reviewed.  Patient was instructed to choose a chewable complete multivitamin with iron in NON-gummy form. Selection of probiotic was also reviewed.    Comments: During today's class we talked about making healthy dietary choices during the holidays. Patient was instructed on:    Thoughtful and deliberate eating habits.  Patient was given the guidelines to keep their carbohydrates less than 50-75 grams per day in the pre-op phase.  Patient was also given ideas for substituting lower carbohydrate food options for typical  carbohydrate-heavy food options, such as carrot souffle for sweet potatoes.  Discussed planning and preparing for holiday meals through food ingredient substitutions in recipes.  Reviewed appropriate holiday beverages.  Suggestions for "sweet treats" that could be used in place of carbohydrate- and fat-heavy desserts typical during the holidays were provided.    Handouts Provided:  Bariatric Friendly Holiday Recipes  Holiday Eating Survival Guide    Patient's current diet habits include:  Patient is eating 3 meals and 4-5 snacks per day.   Pt has found 1 protein supplement shakes for use post-op in meeting their protein needs.    Patient's plan for dietary and/or behavior changes in the upcoming month: "continue to make better choices; not eating sweets (outshine popsicles); still working on meal prepping".     Physical Activity/Exercise    Comments:  We talked about exercise.  Patient was given reasons of why exercise is so important and how that can help with their long-term success.  I have encouraged patient to get a support system to help with the activity.    We talked about recommended types of physical activity, including walking, swimming, cycling, or chair exercises.  I also talked with patient about doing some strength training, which helps the metabolism, as well as strengthen muscles and bones.  Patient's plan to incorporate more activity includes: "Instead of walking 3 days a week; increase to 5 days a week".      Behavior Modification     Comments:  During today's class, we discussed the benefits of regular physical activity. Specific guidelines for cardiovascular exercise and resistance/strength training were reviewed, as well as appropriate types of physical activity.  Patient was directed to the handout, "Overcoming Barriers to Exercise", where we reviewed the importance of making physical activity part of a healthy lifestyle rather than just a way of meeting a weight loss goal.  We also discussed   for specific tips on fitting physical activity in when patients feel they are too busy to exercise and during inclement weather, as well as ideas for low/no cost exercise, and ways to exercise despite physical limitations.  Patient was instructed to discuss any physical limitations with their healthcare provider.  A list of ways to facilitate regular physical activity was reviewed, as well as how to get started with a regular physical activity regimen.      Goals:   Work to increase to 3-4 small meals per day, with 1-2 planned snacks as needed.  Recommend following the plate method for meal planning - focusing on lean protein, non-starchy vegetables, and measured amounts of complex carbohydrates.   - Goal of 80-100 g protein and <100 g carbohydrates per day.               - Select at least 2 DIFFERENT protein supplements that can be used for protein supplementation to meet goals pre- and post-operatively.   - Practice Carbohydrate Counting and label reading.   - Follow 3 g rule for fat and  sugar.  2. Slow down meals  - Chew each bite 25-35 times.  - Aim for 20-30 min/meal.  3. Increase non-caloric fluids to 64 oz per day.  Eliminate caffeine, added sugar, carbonation, and straws.               - Continue to work to decrease sugar sweetened beverages - goal of calorie-free beverages only.              - Must eliminate caffeine prior to surgery and avoid for ~6-8 weeks.   - Practice 30:30 rule, separating food and fluid intake.  4. Start activity regimen, work to increase ADLs.  5. Start Complete MVI and probiotic at least 30 days pre-op.

## 2023-09-23 ENCOUNTER — Inpatient Hospital Stay: Admit: 2023-09-23 | Discharge: 2023-09-24 | Payer: MEDICAID

## 2023-09-23 ENCOUNTER — Emergency Department: Admit: 2023-09-24 | Payer: MEDICAID | Primary: Medical

## 2023-09-23 ENCOUNTER — Encounter: Payer: MEDICAID | Attending: Specialist | Primary: Medical

## 2023-09-23 DIAGNOSIS — M2392 Unspecified internal derangement of left knee: Secondary | ICD-10-CM

## 2023-09-23 NOTE — Discharge Instructions (Signed)
Alternate ice and moist heat. Keep elevated. Wear Short knee immobilizer for compression and stability. Use crutches as needed. Rest your knee as much as possible. F/U with Orthopedics if no improvement.

## 2023-09-23 NOTE — ED Triage Notes (Signed)
Pt alert and oriented ambulatory to triage with c/o of left knee pain 7/10 at rest 10/10 when walking that has gotten worse tonight. Pt states this has been going on since May, but not this bad.  Pt has been taking Motrin 800mg  and Tylenol 1000mg  with minimal relief.  Pt denies any known trauma or injury to knee.    Past Medical History:   Diagnosis Date    Arthritis     Asthma     rare    Lumbar pars defect     Morbid obesity     Morbid obesity with BMI of 45.0-49.9, adult     Skin abscess 06/2020

## 2023-09-23 NOTE — ED Notes (Signed)
I have reviewed discharge instruction and prescriptions with patient.    Patient verbalized understanding and has no further questions at this time.    Education taught and patient verbalized understanding of education.  Teach back method used.  Patient discharged ambulatory to home with paperwork in hand.

## 2023-09-23 NOTE — ED Provider Notes (Signed)
[facility]  EMERGENCY DEPARTMENT ENCOUNTER      Pt Name: Monique Rogers  Pt Age: 37 y.o.  Sex: female   MRN: [id number]  CSN: [id number]   Birthdate 08-09-1986  Date of evaluation: 09/23/2023  Provider: Leamon Arnt, PA  Room: HFT1/QA  Time Dictated: 8:25 PM    Chief Complaint   Chief Complaint   Patient presents with    Left Knee Pain       History of Present Illness   The history is provided by the patient. No language interpreter was used.       7:37 PM  37 y.o. female presents to the ED C/O LT knee pain.  Patient reports that she started to have pain in 02/2023 but after walking around with her daughter on 09/15/2023 the pain significantly worsened. She has no pain at rest but when she walks on it the pain is throbbing. Today at clinicals he felt like her knee was going to give out on her all day and the pain is getting intolerable.  Patient has been taking Tylenol and Motrin with mild relief. No fever, chills, SOB, difficulty breathing, wheezing, back pain, chest pain, lower leg/calf pain, edema, ecchymosis, erythema, calor, or deformity.      Nursing Note reviewed    REVIEW OF SYSTEMS    (2-9 systems for level 4, 10 or more for level 5)   Review of Systems   Constitutional:  Negative for chills and fever.   HENT:  Negative for congestion, rhinorrhea and sore throat.    Eyes:  Negative for discharge and redness.   Respiratory:  Negative for cough, shortness of breath and wheezing.    Cardiovascular:  Negative for chest pain and palpitations.   Gastrointestinal:  Negative for abdominal pain, constipation, diarrhea, nausea and vomiting.   Genitourinary:  Negative for dysuria, frequency and urgency.   Musculoskeletal:  Positive for arthralgias (LT Knee pain). Negative for back pain, myalgias and neck pain.   Skin:  Negative for rash and wound.   Neurological:  Negative for dizziness and headaches.   Hematological:  Negative for adenopathy.   Psychiatric/Behavioral:  Negative for agitation and confusion.         PAST MEDICAL HISTORY     Past Medical History:   Diagnosis Date    Arthritis     Asthma     rare    Lumbar pars defect     Morbid obesity     Morbid obesity with BMI of 45.0-49.9, adult     Skin abscess 06/2020         SURGICAL HISTORY       Past Surgical History:   Procedure Laterality Date    BACK SURGERY      hardware in back 2007    BREAST REDUCTION SURGERY  2017    CESAREAN SECTION         CURRENT MEDICATIONS       Previous Medications    BACILLUS COAGULANS-INULIN (PROBIOTIC) 1-250 BILLION-MG CAPS    Take by mouth    CHOLECALCIFEROL (VITAMIN D3) 50 MCG (2000 UT) CAPS    Take 1 capsule by mouth daily    LEVONORGESTREL (MIRENA, 52 MG,) IUD 52 MG    1 Device by IntraUTERine route once    MULTIPLE VITAMINS-MINERALS (THERAPEUTIC MULTIVITAMIN-MINERALS) TABLET    Take 1 tablet by mouth daily       ALLERGIES     Latex and Penicillins    FAMILY HISTORY  No family history on file.       SOCIAL HISTORY       Social History     Socioeconomic History    Marital status: Single   Tobacco Use    Smoking status: Former     Current packs/day: 0.00     Types: Cigarettes     Quit date: 11/15/2010     Years since quitting: 12.8    Smokeless tobacco: Never   Vaping Use    Vaping status: Never Used   Substance and Sexual Activity    Alcohol use: Yes     Comment: rare    Drug use: Never    Sexual activity: Yes     Partners: Male     Birth control/protection: I.U.D.     Comment: Mirena       SCREENINGS         Glasgow Coma Scale  Eye Opening: Spontaneous  Best Verbal Response: Oriented  Best Motor Response: Obeys commands  Glasgow Coma Scale Score: 15                     CIWA Assessment  BP: (!) 141/73  Pulse: 82                 PHYSICAL EXAM    (up to 7 for level 4, 8 or more for level 5)     ED Triage Vitals [09/23/23 1927]   BP Systolic BP Percentile Diastolic BP Percentile Temp Temp Source Pulse Respirations SpO2   (!) 141/73 -- -- 98 F (36.7 C) Oral 82 19 100 %      Height Weight         -- --             Physical  Exam  Vitals and nursing note reviewed.   Constitutional:       General: She is not in acute distress.     Appearance: Normal appearance.   HENT:      Head: Normocephalic and atraumatic.   Cardiovascular:      Rate and Rhythm: Normal rate and regular rhythm.      Pulses: Normal pulses.      Heart sounds: Normal heart sounds.   Pulmonary:      Effort: Pulmonary effort is normal.      Breath sounds: Normal breath sounds.   Musculoskeletal:         General: Tenderness present. No swelling, deformity or signs of injury. Normal range of motion.      Cervical back: Normal range of motion and neck supple. No tenderness.      Right lower leg: No edema.      Left lower leg: No edema.      Comments: LT anteromedial knee is TTP with pain on standing and movement. Lax MCL with valgus/varum stretch and positive Apley's and McMurray's for possible Meniscus injury and/or MCL injury. No erythema, edema, calor, ecchymosis, or deformity. Negative Lachman's/Drawer sign     Skin:     General: Skin is warm and dry.      Findings: No bruising, erythema or rash.   Neurological:      General: No focal deficit present.      Mental Status: She is alert and oriented to person, place, and time.      Sensory: No sensory deficit.      Motor: No weakness.      Gait: Gait normal.   Psychiatric:  Mood and Affect: Mood normal.         Behavior: Behavior normal.         DIAGNOSTIC RESULTS     XR KNEE LEFT (3 VIEWS)    (Results Pending)   XR KNEE RIGHT (3 VIEWS)    (Results Pending)       Interpretation per the Radiologist below, if available at the time of this note:    XR KNEE LEFT (3 VIEWS)    (Results Pending)   XR KNEE RIGHT (3 VIEWS)    (Results Pending)       RADIOLOGY:   Non-plain film images such as CT, Ultrasound and MRI are read by the radiologist. Plain radiographic images are visualized and preliminarily interpreted by the emergency physician with the below findings:    LABS:  Labs Reviewed - No data to display    All other labs  were within normal range or not returned as of this dictation.    No results found for this or any previous visit (from the past 12 hour(s)).    MEDICATIONS GIVEN:  Medications - No data to display    PROCEDURES:  Unless otherwise noted below, none     Splint Application    Date/Time: 09/23/2023 8:24 PM    Performed by: Leamon Arnt, PA  Authorized by: Leamon Arnt, PA    Consent:     Consent obtained:  Verbal    Consent given by:  Patient    Risks, benefits, and alternatives were discussed: yes      Risks discussed:  Discoloration, numbness, pain and swelling    Alternatives discussed:  No treatment, delayed treatment, alternative treatment, observation and referral  Universal protocol:     Procedure explained and questions answered to patient or proxy's satisfaction: yes      Imaging studies available: yes      Immediately prior to procedure a time out was called: yes      Patient identity confirmed:  Verbally with patient and arm band  Pre-procedure details:     Distal neurologic exam:  Normal    Distal perfusion: distal pulses strong    Procedure details:     Location:  Knee    Knee location:  L knee    Strapping: no      Splint type:  Knee immobilizer    Supplies:  Prefabricated splint  Post-procedure details:     Distal neurologic exam:  Normal    Distal perfusion: distal pulses strong      Procedure completion:  Tolerated  Comments:      SPLINT ASSESSMENT:  Short Knee Immobilizer Splint was correctly applied to the LT knee by RN. Splint is in a good position.  Pulse, motor and sensation were intact before and after splint was applied.        Impression / ED Course / Medical Decision Making   Vitals:    Vitals:    09/23/23 1927 09/23/23 2009   BP: (!) 141/73    Pulse: 82    Resp: 19    Temp: 98 F (36.7 C)    TempSrc: Oral    SpO2: 100%    Weight:  113.4 kg (250 lb)   Height:  1.524 m (5')     Medical Decision Making  Problems Addressed:  Acute pain of left knee: acute illness or injury  Internal  derangement of left knee: acute illness or injury    Amount and/or Complexity of Data Reviewed  Radiology: ordered.  Decision-making details documented in ED Course.    Risk  OTC drugs.  Prescription drug management.        DDx: Knee pain, Knee strain, Knee contusion, Knee fracture, Meniscus tear/injury, ligament tear/injury, knee effusion.    Impression: LT knee pain, Internal Derangement of LT knee.    Management Plan: Evaluate and treat LT knee pain. Obtained LT knee XR. Prescribe Mobic. Applied a short knee immobilizer. Recommended an orthopedic to F/U with and advised RICE therapy alternating with heat and PCP F/U if no improvement. Patient was in agreement with discharge plan and patient was discharged in good condition.    PROGRESS NOTE:   7:37 PM  Initial assessment completed.       REASSESSMENT        CONSULTS:  None    DISCHARGE NOTE:  8:24 PM  Lajean Silvius  results have been reviewed with her.  She has been counseled regarding her diagnosis, treatment, and plan.  She verbally conveys understanding and agreement of the signs, symptoms, diagnosis, treatment and prognosis and additionally agrees to follow up as discussed.  She also agrees with the care-plan and conveys that all of her questions have been answered.  I have also provided discharge instructions for her that include: educational information regarding their diagnosis and treatment, and list of reasons why they would want to return to the ED prior to their follow-up appointment, should her condition change.     FINAL IMPRESSION      1. Internal derangement of left knee    2. Acute pain of left knee            DISPOSITION/PLAN   DISPOSITION Discharge - Pending Orders Complete 09/23/2023 08:23:52 PM            Medication List        START taking these medications      meloxicam 15 MG tablet  Commonly known as: Mobic  Take 1 tablet by mouth daily            ASK your doctor about these medications      Mirena (52 MG) IUD 52 mg  Generic drug:  levonorgestrel     Probiotic 1-250 BILLION-MG Caps     therapeutic multivitamin-minerals tablet     vitamin D 50 MCG (2000 UT) Caps capsule  Commonly known as: CHOLECALCIFEROL               Where to Get Your Medications        These medications were sent to [id number] [place], [place] - [address] [address] [phone] - F [phone]  [address] [place] [address]      Phone: [phone]   meloxicam 15 MG tablet          PATIENT REFERRED TO:  Leda Roys, PA  [address]  [place] [address]  [phone]    Go to   As needed    Peggyann Shoals, DO  [address]  [place] [address]  [phone]    Call in 3 days  To schedule an appointment for follow up.      DISCHARGE MEDICATIONS:  New Prescriptions    MELOXICAM (MOBIC) 15 MG TABLET    Take 1 tablet by mouth daily     Controlled Substances Monitoring:          No data to display  Nursing notes have been reviewed by the physician/advanced practice    Clinician.    Leamon Arnt PA-C  September 23, 2023           Leamon Arnt, [place]  09/23/23 2025

## 2023-09-23 NOTE — ED Notes (Signed)
Left knee immobilizer applied and crutches given to patient.  Pt acknowledge understanding of use.  No questions or concerns at this time.

## 2023-09-24 ENCOUNTER — Inpatient Hospital Stay: Admit: 2023-09-24 | Discharge: 2023-09-24 | Disposition: A | Discharge status: Home / Self Care | Payer: MEDICAID

## 2023-09-24 MED ORDER — MELOXICAM 15 MG PO TABS
15 | ORAL_TABLET | Freq: Every day | ORAL | 3 refills | 30.00000 days | Status: DC
Start: 2023-09-24 — End: 2024-12-03

## 2023-10-03 ENCOUNTER — Ambulatory Visit
Admit: 2023-10-03 | Discharge: 2023-10-03 | Payer: MEDICAID | Attending: Specialist | Admitting: Specialist | Primary: Medical

## 2023-10-03 LAB — SENTARA SPECIMEN COLLECTION

## 2023-10-03 NOTE — Patient Instructions (Signed)
 Patient Instructions      1. Continue to monitor carbohydrate and protein intake- remember to keep your           total  carbohydrates to 50 grams or less per day for best results.  2. Remember hydr

## 2023-10-03 NOTE — Progress Notes (Signed)
 Bariatric Surgery Preoperative Progress Note    Subjective:     Monique Rogers is a 37 y.o. obese female with a Body mass index is 46.05 kg/m..  she desires surgery at this time because of health issues and quality of life issues.  [name] has been se

## 2023-10-04 LAB — COMPREHENSIVE METABOLIC PANEL
ALT: 22 U/L (ref 5–40)
AST: 20 U/L (ref 10–37)
Albumin/Globulin Ratio: 1.6 {ratio} (ref 1.1–2.6)
Albumin: 4.3 g/dL (ref 3.5–5.0)
Alkaline Phosphatase: 80 U/L (ref 25–115)
Anion Gap: 12 mmol/L (ref 3.0–15.0)
BUN: 18 mg/dL (ref 6–22)
CO2: 25 mmol/L (ref 20–32)
Calcium: 9.8 mg/dL (ref 8.4–10.5)
Chloride: 102 mmol/L (ref 98–110)
Creatinine: 1 mg/dL (ref 0.5–1.2)
Est, Glom Filt Rate: >60 mL/min/{1.73_m2} (ref >60.0)
Globulin: 2.7 g/dL (ref 2.0–4.0)
Glucose: 80 mg/dL (ref 70–99)
Potassium: 5 mmol/L (ref 3.5–5.5)
Sodium: 139 mmol/L (ref 133–145)
Total Bilirubin: 0.2 mg/dL (ref 0.2–1.2)
Total Protein: 7 g/dL (ref 6.4–8.3)

## 2023-10-04 LAB — CBC WITH AUTO DIFFERENTIAL
Basophils %: 1 % (ref 0–2)
Basophils Absolute: 0.1 10*3/uL (ref 0.0–0.2)
Eosinophils Absolute: 0.4 10*3/uL (ref 0.0–0.5)
Eosinophils: 4 % (ref 0–6)
Hematocrit: 43.3 % (ref 35.1–46.5)
Hemoglobin: 13.4 g/dL (ref 11.7–15.5)
Lymphocytes Absolute: 3.4 10*3/uL (ref 1.0–4.8)
Lymphocytes: 37 % (ref 20–45)
MCH: 27 pg (ref 26–34)
MCHC: 31 g/dL (ref 31–36)
MCV: 86 fL (ref 80–99)
MPV: 11.8 fL (ref 9.0–13.0)
Monocytes Absolute: 0.7 10*3/uL (ref 0.1–1.0)
Monocytes: 8 % (ref 3–12)
Neutrophils Absolute: 4.4 10*3/uL (ref 1.8–7.7)
Neutrophils: 49 % (ref 40–75)
Platelets: 270 10*3/uL (ref 140–440)
RBC: 5.06 M/uL (ref 3.80–5.20)
RDW: 15.4 % (ref 10.0–15.5)
WBC: 9 10*3/uL (ref 4.0–11.0)

## 2023-10-04 LAB — TSH: TSH: 2.05 uU/mL (ref 0.27–4.20)

## 2023-10-04 LAB — T4, FREE: T4 Free: 1.2 ng/dL (ref 0.9–1.8)

## 2023-10-20 ENCOUNTER — Inpatient Hospital Stay: Admit: 2023-10-20 | Discharge: 2023-10-21 | Payer: MEDICAID | Primary: Medical

## 2023-10-20 NOTE — Progress Notes (Signed)
 Medical Weight Loss Multi-Disciplinary Program    Patient's Name: Monique Rogers Age: 37 y.o.   Date of Birth: 1986/11/10 Sex: female     Session #5. Pt attended in-person class.  Weight obtained in office.    Date: 10/20/2023    Vitals:    10/20/23 1558   We

## 2023-11-22 ENCOUNTER — Inpatient Hospital Stay: Payer: MEDICAID | Primary: Medical

## 2023-11-22 NOTE — Progress Notes (Signed)
 Weight Loss Trial Class   NO SHOW    Patient's Name: Monique Rogers  Date of Birth: 1986-07-03    Patient did not show for weight loss trial class # 5.      This is patient's 2nd missed class.    Action:    Yes Patient was sent an e-mail to contact me to reschedule their appointments, if they are still interested in surgery. My contact information was provided.                       Additional Comments:  Pts VM box was no set up yet.    Arland Dean, MS/MCD, RD  11/23/2023

## 2024-08-21 ENCOUNTER — Ambulatory Visit: Admit: 2024-08-21 | Discharge: 2024-08-21 | Payer: PRIVATE HEALTH INSURANCE | Attending: Specialist | Primary: Medical

## 2024-08-21 NOTE — Patient Instructions (Signed)
 "     Body Mass Index: Care Instructions  Your Care Instructions    Body mass index (BMI) can help you see if your weight is raising your risk for health problems. It uses a formula to compare how much you weigh with how tall you are.  A BMI lower than 18.5 is considered underweight.  A BMI between 18.5 and 24.9 is considered healthy.  A BMI between 25 and 29.9 is considered overweight. A BMI of 30 or higher is considered obese.  If your BMI is in the normal range, it means that you have a lower risk for weight-related health problems. If your BMI is in the overweight or obese range, you may be at increased risk for weight-related health problems, such as high blood pressure, heart disease, stroke, arthritis or joint pain, and diabetes. If your BMI is in the underweight range, you may be at increased risk for health problems such as fatigue, lower protection (immunity) against illness, muscle loss, bone loss, hair loss, and hormone problems.  BMI is just one measure of your risk for weight-related health problems. You may be at higher risk for health problems if you are not active, you eat an unhealthy diet, or you drink too much alcohol or use tobacco products.  Follow-up care is a key part of your treatment and safety. Be sure to make and go to all appointments, and call your doctor if you are having problems. It's also a good idea to know your test results and keep a list of the medicines you take.  How can you care for yourself at home?  Practice healthy eating habits. This includes eating plenty of fruits, vegetables, whole grains, lean protein, and low-fat dairy.  If your doctor recommends it, get more exercise. Walking is a good choice. Bit by bit, increase the amount you walk every day. Try for at least 30 minutes on most days of the week.  Do not smoke. Smoking can increase your risk for health problems. If you need help quitting, talk to your doctor about stop-smoking programs and medicines. These can  increase your chances of quitting for good.  Limit alcohol to 2 drinks a day for men and 1 drink a day for women. Too much alcohol can cause health problems.  If you have a BMI higher than 25  Your doctor may do other tests to check your risk for weight-related health problems. This may include measuring the distance around your waist. A waist measurement of more than 40 inches in men or 35 inches in women can increase the risk of weight-related health problems.  Talk with your doctor about steps you can take to stay healthy or improve your health. You may need to make lifestyle changes to lose weight and stay healthy, such as changing your diet and getting regular exercise.  If you have a BMI lower than 18.5  Your doctor may do other tests to check your risk for health problems.  Talk with your doctor about steps you can take to stay healthy or improve your health. You may need to make lifestyle changes to gain or maintain weight and stay healthy, such as getting more healthy foods in your diet and doing exercises to build muscle.  Where can you learn more?  Go to [employer].  Enter S176 in the search box to learn more about Body Mass Index: Care Instructions.  Current as of: May 10, 2017  Content Version: 11.8   2006-2018 [employer]. Care  instructions adapted under license by [employer] (which disclaims liability or warranty for this information). If you have questions about a medical condition or this instruction, always ask your healthcare professional. [employer] disclaims any warranty or liability for your use of this information.       "

## 2024-08-21 NOTE — Progress Notes (Signed)
 " Monique Rogers  Original Consult in July 2024 at 251 lbs    Subjective:     The patient is a 38 y.o. obese female with a Body mass index is 47.24 kg/m.Monique Rogers  The patient is currently her heaviest weight for the past several years.  she has been overweight since childhood.   she has been considering surgery since last year.  she desires surgery at this time because of multiple health concerns and their lifestyle issues which are hindered by their weight. she has been referred by his family physician for evaluation and treatment of their obesity via surgical intervention. Monique Rogers has tried multiple diets in her lifetime most recently tried multiple dietary plans    She did not proceed due insurance issues    At her 2024 consult she was not asked to obtain any special clearances    Monique comorbidities present are   Patient Active Problem List   Diagnosis    Chronic low back pain    Morbid obesity (HCC)    Morbid obesity with BMI of 45.0-49.9, adult (HCC)    Asthma     The patient is considering sleeve resection for surgical weight loss due to their ineffective progress with medical forms of weight loss and the urging of their physician who cares for their primary medical issues. The patient  now presents  for consideration for weight loss surgery understanding the benefits of this over a medical approach of weight loss as was discussed in our presentation on weight loss surgery. They have discussed their plans both with their family and primary care physician who is in support of their pursuit of such. The patient has had no health issues as of late and denies and gastrointestinal disturbances other than what is outlined below in their review of symptoms. All of their prior evaluations available by both their PCP's and specialists physicians have been reviewed today either in the Care Everywhere portal or scanned under the media tab.    I have spent a large portion of my initial Rogers  today reviewing the patients current dietary habits which have contributed to their health issues and obesity.  I have suggested to them personally a dietary regimen that they can initiate now to help with their status as it pertains to their weight.  They understand that the most important aspect of their journey through their weight loss endeavor will be their adherence to a new lifestyle of healthy eating behavior. They also understand that an adherence to an exercise program will not only help with weight loss but is ultimately important in weight maintenance.    The patients goal weight is 147 lb.     Patient Active Problem List    Diagnosis Date Noted    Morbid obesity with BMI of 45.0-49.9, adult (HCC) 06/01/2023    Asthma 06/01/2023    Chronic low back pain 01/06/2021    Morbid obesity (HCC) 01/06/2021     Past Surgical History:   Procedure Laterality Date    BACK SURGERY      hardware in back 2007    BREAST REDUCTION SURGERY  2017    CESAREAN SECTION        Social History     Tobacco Use    Smoking status: Former     Current packs/day: 0.00     Types: Cigarettes     Quit date: 11/15/2010     Years since quitting: 13.7    Smokeless tobacco: Never  Substance Use Topics    Alcohol use: Yes     Comment: rare      No family history on file.   Current Outpatient Medications   Medication Sig Dispense Refill    Cholecalciferol (VITAMIN D3) 50 MCG (2000 UT) CAPS Take 1 capsule by mouth daily      Multiple Vitamins-Minerals (THERAPEUTIC MULTIVITAMIN-MINERALS) tablet Take 1 tablet by mouth daily      meloxicam  (MOBIC ) 15 MG tablet Take 1 tablet by mouth daily (Patient not taking: Reported on 08/21/2024) 30 tablet 3    Bacillus Coagulans-Inulin (PROBIOTIC) 1-250 BILLION-MG CAPS Take by mouth (Patient not taking: Reported on 08/21/2024)       No current facility-administered medications for this visit.     Allergies   Allergen Reactions    Latex Rash    Penicillins Rash        Review of Systems:     General - No history or  complaints of unexpected fever, chills, or weight loss  Head/Neck - No history or complaints of headache, diplopia, dysphagia, hearing loss  Cardiac - No history or complaints of chest pain, palpitations, murmur, or shortness of breath  Pulmonary - No history or complaints of shortness of breath, productive cough, hemoptysis  Gastrointestinal - (+) reflux, no abdominal pain, obstipation/constipation or blood per rectum  Genitourinary - No history or complaints of hematuria/dysuria, stress urinary incontinence symptoms, or renal lithiasis  Musculoskeletal - mild joint pain in their knees,  no muscular weakness  Hematologic - No history or complaints of bleeding disorders,  No blood transfusions  Neurologic - No history or complaints of  migraine headaches, seizure activity, syncopal episodes, TIA or stroke  Integumentary - No history or complaints of rashes, abnormal nevi, skin cancer  Gynecological - unremarkable    Objective:     BP 138/75 (BP Site: Right Upper Arm, Patient Position: Sitting, BP Cuff Size: Large Adult)   Pulse 67   Temp 97.2 F (36.2 C)   Ht 1.575 m (5' 2)   Wt 117.2 kg (258 lb 4.8 oz)   SpO2 99%   BMI 47.24 kg/m     Physical Examination: General appearance - alert, well appearing, and in no distress  Mental status - alert, oriented to person, place, and time  Eyes - pupils equal and reactive, extraocular eye movements intact  Ears - external ear canals normal  Nose - normal and patent, no erythema, discharge or polyps  Mouth - mucous membranes moist, pharynx normal without lesions  Neck - supple, no significant adenopathy  Lymphatics - no palpable lymphadenopathy, no hepatosplenomegaly  Chest - clear to auscultation, no wheezes, rales or rhonchi, symmetric air entry  Heart - normal rate, regular rhythm, normal S1, S2, no murmurs, rubs, clicks or gallops  Abdomen - soft, nontender, nondistended, no masses or organomegaly  Back exam - full range of motion, no tenderness, palpable spasm or  pain on motion  Neurological - alert, oriented, normal speech, no focal findings or movement disorder noted  Musculoskeletal - no joint tenderness, deformity or swelling  Extremities - peripheral pulses normal, no pedal edema, no clubbing or cyanosis  Skin - normal coloration and turgor, no rashes, no suspicious skin lesions noted    Labs:       No results found for this or any previous visit (from the past 1440 hours).    Assessment:     Morbid obesity with Rogers    Plan:     Sleeve resection  This is a 38 y.o. female with a BMI of Body mass index is 47.24 kg/m. and the weight-related co-morbidties as noted below. Monique Rogers meets the NIH criteria for Monique surgery based upon the BMI of Body mass index is 47.24 kg/m. and multiple weight-related co-morbidties. Monique Rogers has elected laparoscopic sleeve gastrectomy as her intervention of choice for treatment of morbid obestiy through surgical means secondary to its safety profile, rapid return to work  and decreases in operative risks over gastric bypass.    In the office today, following Kalle's history and physical examination, a 30 minute discussion regarding the anatomic alterations for the laparoscopic sleeve gastrectomy was undertaken. The dietary expectations and the patient and physician dependent factors for success were thoroughly discussed, to include the need for interval follow-up and long-term dietary changes associated with success. The possible complications of the sleeve gastrectomy  were also discussed, to include;death, DVT/PE, staple line leak, bleeding, stricture formation, infection, nutritional deficiencies and sleeve dilation.  Specific weight related outcomes for success were also discussed with an emphasis on careful and close follow-up with the first year and eating behavior modification as the baseline and cyclical hunger return.  The patient expressed an understanding of the above factors, and her questions were answered in their  entirety.    In addition, the patient attended a 1.5 hour power point seminar regarding obesity, surgical weight loss including, adjustable gastric band, gastric bypass, and sleeve gastrectomy.  This discussion contrasted the different surgical techniques, mechanisms of actions and expected outcomes, and surgical and medical risks associated with each procedure.  During this seminar, there was a long question and answer session where each questions was answered until there were no additional questions.     Today, the patient had all of her questions answered and desires to proceed with  Monique surgery initially choosing sleeve gastrectomy as her surgical option.    Secondary Diagnoses:     Dietary Intervention  - The patient is currently scheduled to see or has been followed by a Monique nutritionist for an attempt at preoperative weight loss as has been dictated by their insurance carrier.  They will be assessed at various times during their follow up to evaluate their progress depending on the length of time that is required once again by their carrier.  I have explained the importance of preoperative weight loss and the benefits regarding lower surgical risk and also assisting the patient in reaching their weight loss goal.  Finally they understand their is a physiologic benefit from the standpoint of hepatic volume reduction preoperatively.  I have reiterated the importance of a low carbohydrate and high protein regimen to achieve their stated goal.     GERD -The patient understands that weight loss surgery is not a guaranteed cure for reflux disease but does understand the benefits that weight loss can have on reflux disease.  They also understand that at the time of surgery the gastroesophageal junction will be evaluated for the presence of a diaphragmatic hernia.  Hernias will be corrected always with the gastric band and sleeve gastrectomy procedures, but only on a case by case basis with the gastric  bypass if it prevents our ability to perform the operation at hand, or if I feel that they would benefit long term with correction of this issue.  The patient also understands that neither weight loss surgery nor repair of a diaphragmatic hernia repair guarantees the complete cessation of the disease.They also understand there is a possibility of recurrence with  a simple crural repair as is performed with these procedures. They understand they may have to continue their medications in the postoperative period. They have a good understanding that the gastric bypass procedure is better suited to total resolution of this issue and that neither the Lap Band nor sleeve gastrectomy is considered a curative procedure as it pertains to this diagnosis.    Restrictive Airway Disease - We will continue all of their pulmonary medications in the form of oral pills and inhalers in both the perioperative and postoperative period. They understand that their symptoms should improve with weight loss. Any further testing related to this will be turned over to their family physician or pulmonologist.     Weight Related Arthritis -The patient understands the benefits that weight loss surgery can have on their arthritis but also understands that weight loss is not a guaranteed cure and relief of symptoms is often dependent on the severity of the underlying disease.  The patient also understands that traditional pharmaceutical treatments for this diagnosis are usually unavailable to post-operative weight loss patients due to the effects on the gastrointestinal tract particularly with the gastric bypass and to a lesser effect with the sleeve gastrectomy.  Any changes to the patients medication treatment will ultimately be made the patients PCP with input by our office.    Signed By: Monique GORMAN Breen, PA     August 21, 2024         "

## 2024-09-12 ENCOUNTER — Inpatient Hospital Stay: Admit: 2024-09-12 | Discharge: 2024-09-13 | Payer: PRIVATE HEALTH INSURANCE | Primary: Medical

## 2024-09-12 NOTE — Progress Notes (Signed)
 "  Medical Weight Loss Multi-Disciplinary Program    Patient's Name: Monique Rogers Age: 38 y.o.   Date of Birth: [birth year] Sex: female      Session #1. Pt attended in-person class.  Weight obtained in office.    Date: 09/12/2024    Vitals:    09/12/24 0742   Weight: 120 kg (264 lb 9.6 oz)   Height: 1.575 m (5' 2)      Body mass index is 48.4 kg/m.     Pounds Lost since last month: N/A   Pounds Gained since last month: N/A    Starting Weight: 258.3 lbs  Previous Months Weight: N/A  Overall Pounds Lost: 0.0 lbs  Overall Pounds Gained: 6.3 lbs      Class Guidelines    Guidelines are reviewed with patient at the start of every class.    1. Patient understands that weight loss trial classes must be consecutive.  Patient understands if they miss a class, it is their responsibility to contact me to reschedule class.  I will reach out to patient after their first no show.  2.  Patient understands the expectations that weight maintenance/weight loss is expected during the classes.  Failure to demonstrate changes may result in extension of weight loss trial, followed by returning to see the surgeon.  Patient understands that they CANNOT gain any weight during the weight loss trial.  Gaining weight will result in extension of weight loss trial.  3. Patient is also instructed to complete their labwork, psychological evaluation visit, and any other tests that the surgeon has used while they are working on their weight loss trial.  4.  Patient was instructed to bring their packet of nutrition education materials to every class and appointment.        Eating Habits and Behaviors    Today in class we reviewed the Key Diet Principles.  Patient was encouraged to consume 3 meals each day, and the timing the meals was reviewed.  Meal time behaviors that will help pt to be successful with their weight loss efforts were additionally reviewed.      We discussed the importance of drinking adequate amounts of fluids, recommending that  patient consume a minimum of 64 oz of sugar-free, caffeine-free and carbonation-free fluids each day.  Patient was encouraged to eliminate sugar-sweetened beverages such as sweet tea, fruit juice, fruit smoothies, flavored coffee drinks and regular sodas.      During the weight loss trial, patient was encouraged to focus on eating protein-forward meals, with a daily protein goal of 80-100 grams.  Patient was also advised to decrease carbohydrate intake to 50-100 g/d or less, choosing limited amounts of complex, high-fiber vs. simple and processed/refined carbohydrates.  We also discussed limiting fat intake.  Encouraged patient to follow the 3-gram rule when choosing foods by looking for items containing <3 g of total fat,  and total sugar, with 0 g added sugar per serving.  We reviewed meal planning guidelines and discussed appropriate meal and snack options.  We also talked about protein supplements, and patient was encouraged to start trying these, using them either for a meal replacement or a protein-rich snack up to twice daily before surgery.  We reviewed the nutritional guidelines for selecting protein shakes and pt was instructed to find at least 2 different brands of approved protein supplement shake before surgery.      Pre-operative vitamin and mineral supplementation were reviewed.  Patient was instructed to choose a chewable complete multivitamin  with 18 mg iron in NON-gummy form. Selection of probiotic was also reviewed.  Pt was additionally encouraged to start taking a B-50 or B-100 Vitamin complex when starting multivitamin and probiotic at least 30 days pre-op.    Patient's current diet habits include:  Patient is eating 3 meals and 3-4 snacks per day.  Pt has found 2 protein supplement shakes for use post-op in meeting their protein needs.    Patient's plan for dietary and/or behavior changes in the upcoming month: STOP drinking soda, and stick to more sugar free drinks.    Physical  Activity/Exercise    Comments:    We talked about exercise.  Patient was given reasons of why exercise is so important and how that can help with their long-term success.  I have encouraged patient to get a support system to help with the activity.  Recommended that pt aim for 150 min/wk moderate to vigorous physical activity.    We talked about recommended types of physical activity, including walking, swimming, cycling, or chair exercises.  I also talked with patient about doing some strength training, which helps the metabolism, as well as strengthen muscles and bones.  Patient's plan to incorporate more activity includes: Getting back to walking 2 miles a day and focusing more purposeful movements.      Behavior Modification     Comments:  During today's class, we discussed mindful eating as a behavior change tool to improve eating behaviors, promote weight loss and long-term weight maintenance, encourage a healthy relationship with food, and prevent chronic disease.  Patient was directed to the handouts on mindful eating that were provided in class.      Goals:   Work to increase to 3-4 small meals per day, with 1-2 planned snacks as needed.  Recommend following the plate method for meal planning - focusing on lean protein, non-starchy vegetables, and measured amounts of complex carbohydrates.   - Goal of 80-100 g protein and <100 g carbohydrates per day.               - Select at least 2 DIFFERENT protein supplements that can be used for protein supplementation to meet goals pre- and post-operatively.   - Practice Carbohydrate Counting and label reading.   - Follow 3 g rule for fat and sugar.  2. Slow down meals  - Chew each bite 25-35 times.  - Aim for 20-30 min/meal.  3. Increase non-caloric fluids to 64 oz per day.  Eliminate caffeine, added sugar, carbonation, and straws.               - Continue to work to decrease sugar sweetened beverages - goal of calorie-free beverages only.              - Must  eliminate caffeine prior to surgery and avoid for ~6-8 weeks.   - Practice 30:30 rule, separating food and fluid intake.  4. Start activity regimen, work to increase ADLs.  5. Start Complete MVI, B-50/B-100 complex, and probiotic at least 30 days pre-op          "

## 2024-10-02 ENCOUNTER — Ambulatory Visit: Admit: 2024-10-02 | Discharge: 2024-10-02 | Payer: PRIVATE HEALTH INSURANCE | Attending: Specialist | Primary: Medical

## 2024-10-02 VITALS — BP 131/83 | HR 82 | Temp 97.30000°F | Ht 62.0 in | Wt 259.8 lb

## 2024-10-02 DIAGNOSIS — J4520 Mild intermittent asthma, uncomplicated: Principal | ICD-10-CM

## 2024-10-02 NOTE — Progress Notes (Signed)
 " Bariatric Surgery Preoperative Progress Note    Subjective:     Monique Rogers is a 38 y.o. obese female with a Body mass index is 47.52 kg/m..  she desires surgery at this time because of health issues and quality of life issues.  Monique Rogers has been seen by a bariatric nutritionist and has been placed on an appropriate low carbohydrate diet. The patient desires laparoscopic sleeve gastrectomy for surgical weight loss, however she is here today to review their workup to date.   Monique Rogers is here also today to check progress with weight loss / evaluate nutritional status and review all subspecialty clearances in hopes of proceeding to the operating room.     Patient Active Problem List    Diagnosis Date Noted    Arthritis     Functional dyspepsia     Smoking history     Morbid obesity with BMI of 45.0-49.9, adult (HCC) 06/01/2023    Asthma 06/01/2023    Chronic low back pain 01/06/2021    Morbid obesity (HCC) 01/06/2021      Past Surgical History:   Procedure Laterality Date    BACK SURGERY      hardware in back 2007    BREAST REDUCTION SURGERY  2017    CESAREAN SECTION      X 1      Social History     Tobacco Use    Smoking status: Former     Current packs/day: 0.00     Types: Cigarettes     Quit date: 11/15/2010     Years since quitting: 13.8    Smokeless tobacco: Never   Substance Use Topics    Alcohol use: Yes     Comment: rare      Family History   Problem Relation Age of Onset    No Known Problems Mother     Drug Abuse Father       Current Outpatient Medications   Medication Sig Dispense Refill    ibuprofen (ADVIL;MOTRIN) 800 MG tablet TAKE 1 TABLET 3 TIMES A DAY BY ORAL ROUTE.      Bacillus Coagulans-Inulin (PROBIOTIC) 1-250 BILLION-MG CAPS Take by mouth      Cholecalciferol (VITAMIN D3) 50 MCG (2000 UT) CAPS Take 1 capsule by mouth daily      Multiple Vitamins-Minerals (THERAPEUTIC MULTIVITAMIN-MINERALS) tablet Take 1 tablet by mouth daily      meloxicam  (MOBIC ) 15 MG tablet Take 1 tablet by mouth daily  (Patient not taking: Reported on 10/02/2024) 30 tablet 3     No current facility-administered medications for this visit.     Allergies   Allergen Reactions    Latex Rash    Penicillins Rash          Review of Systems:            General - No history or complaints of unexpected fever, chills, or weight loss  Head/Neck - No history or complaints of headache, diplopia, dysphagia, hearing loss  Cardiac - No history or complaints of chest pain, palpitations, murmur, or shortness of breath  Pulmonary - No history or complaints of shortness of breath, productive cough, hemoptysis  Gastrointestinal - No history or complaints of reflux,  abdominal pain, obstipation/constipation, blood per rectum  Genitourinary - No history or complaints of hematuria/dysuria, stress urinary incontinence symptoms, or renal lithiasis  Musculoskeletal - No history or complaints of joint pain or muscular weakness  Hematologic - No history or complaints of bleeding disorders, blood transfusions,  sickle cell anemia  Neurologic - No history or complaints of  migraine headaches, seizure activity, syncopal episodes, TIA or stroke  Integumentary - No history or complaints of rashes, abnormal nevi, skin cancer  Gynecological -normal menses           Objective:   BP 131/83 (BP Site: Right Upper Arm, Patient Position: Sitting, BP Cuff Size: Large Adult)   Pulse 82   Temp 97.3 F (36.3 C)   Ht 1.575 m (5' 2)   Wt 117.8 kg (259 lb 12.8 oz)   SpO2 99%   BMI 47.52 kg/m     Physical Examination: General appearance - alert, well appearing, and in no distress and oriented to person, place, and time  Mental status - alert, oriented to person, place, and time, normal mood, behavior, speech, dress, motor activity, and thought processes  Eyes - pupils equal and reactive, extraocular eye movements intact, sclera anicteric, left eye normal, right eye normal  Ears - right ear normal, left ear normal  Neck - supple, no significant adenopathy  Chest - clear to  auscultation, no wheezes, rales or rhonchi, symmetric air entry  Heart - normal rate and regular rhythm  Abdomen - soft, nontender, nondistended, no masses or organomegaly  Back exam - full range of motion, no tenderness, palpable spasm or pain on motion  Neurological - alert, oriented, normal speech, no focal findings or movement disorder noted  Musculoskeletal - no joint tenderness, deformity or swelling  Extremities - no pedal edema noted    Labs:     No results found for this or any previous visit (from the past 12 weeks).        Assessment:     Morbid obesity with associated comorbidity    Plan:     Continuation of Pre-Operative evaluation / clearance.    [name] has returned to the office today to discuss her status as a surgical candidate.    her progress has been noted and reviewed.  We will continue the pre-operative process and work towards goals as outlined.    she has 5-10 more pounds to lose before proceeding to the OR.  (0 pounds lost since last visit)  she has 2 more nutritional visits to complete before proceeding to the OR  she has a psychologic clearance to review before proceeding to the OR.    Monique Rogers understand the rationales for all the above.  It has been discussed that given her   condition   that the best surgical option for this patient would be the laparoscopic sleeve gastrectomy.  Monique Rogers   agrees with the surgical choice and has been educated in it's; risks, benefits, and alternatives.  We will continue   with the pre-operative evaluation as needed to check progress.    Secondary Diagnoses:         Signed By: Curtistine JONETTA Bass, MD     October 02, 2024                 "

## 2024-10-02 NOTE — Patient Instructions (Signed)
 "                                                          Patient Instructions      1. Continue to monitor carbohydrate and protein intake- remember to keep your           total  carbohydrates to 50 grams or less per day for best results.  2. Remember hydration goals - usually 48 to 64 ounces of liquids per day  3. Continue to work towards exercise goals - minimum 3 days per week of 45          minutes to  1 hour at a time.  4. Remember to take vitamins as directed        Supplement Resource Guide    Importance of Protein:   Maintains lean body mass, produces antibodies to fight off infections, heals wounds, minimizes hair loss, helps to give you energy, helps with satiety, and keeping you full between meals.    Importance of Calcium:  Needed for healthy bones and teeth, normal blood clotting, and nervous system functioning, higher risk of osteoporosis and bone disease with non-compliance.    Importance of Multivitamins:  Many functions.  Supply you with extra nutrients that you may be missing from food.  May lead to iron deficiency anemia, weakness, fatigue, and many other symptoms with non-compliance.    Importance of B Vitamins:  Important for red blood cell formation, metabolism, energy, and helps to maintain a healthy nervous system.    Protein Supplement  Find one you like now. Use immediately after surgery.   Look for:  35-50g protein each day from your protein supplement once you reach the progression diet.    0-3 g fat per serving  0-3 g sugar per serving    Protein drinks should be split in separate dosages.    Recommend: Lifelong  1 year + Calcium Supplement:     Start taking within a month after surgery.   Look for: Calcium Citrate Plus D (1500 mg per day)  Recommend: Citracal     .          Avoid chocolate chewable calcium. Can use chewable bariatric or [employer] brand or similar chewable.    The body cannot absorb more than 500-600 mg @ a time.      Take for Life Multi-vitamin Supplement:      1st Month  After Surgery: Any complete chewable, such as: Flintstones Complete chewables.    Avoid Flintstone sours or gummies.  They lack iron and other important nutrients and also have added sugar.    Continue with chewable vitamin or change to adult complete multivitamin one month after surgery. Menstruating women can take a prenatal vitamin.    Make sure has at least 18 mg iron and 400-800 mcg folic acid):    Vitamin B12, B Complex Vitamin, and Biotin  Start taking within a month after surgery.   Vitamin B12:  1000 mcg of Vitamin B12  at least three times weekly    Must take sublingually (meaning you take it under your tongue) or in a liquid drop form for easy absorption.        B Complex Vitamin: Take a pill or liquid drop form once daily.  Biotin: This vitamin can help prevent hair loss.    Recommend 5mg    (5000 mcg) a day  Biotin is Optional        "

## 2024-10-09 ENCOUNTER — Inpatient Hospital Stay: Admit: 2024-10-09 | Discharge: 2024-10-09 | Payer: PRIVATE HEALTH INSURANCE | Primary: Medical

## 2024-10-09 NOTE — Progress Notes (Signed)
 "  Medical Weight Loss Multi-Disciplinary Program    Patient's Name: Monique Rogers Age: 38 y.o.   Date of Birth: 1986/04/16 Sex: female     Session #2. Pt attended in-person class.  Weight obtained in office.    Date: 10/09/2024    Vitals:    10/09/24 1145   Weight: 120.2 kg (265 lb)   Height: 1.575 m (5' 2)      Body mass index is 48.47 kg/m.     Pounds Lost since last month: 0 lbs Pounds Gained since last month: 0.4 lbs       Starting Weight: 258.3 lbs Previous Months Weight: 264.6 lbs   Overall Pounds Lost: 0 lbs  Overall Pounds Gained: 6.7 lbs       Class Guidelines    Guidelines are reviewed with patient at the start of every class.    1. Patient understands that weight loss trial classes must be consecutive.  Patient understands if they miss a class, it is their responsibility to contact me to reschedule class.  I will reach out to patient after their first no show.  2.  Patient understands the expectations that weight maintenance/weight loss is expected during the classes.  Failure to demonstrate changes may result in extension of weight loss trial, followed by returning to see the surgeon.  Patient understands that they CANNOT gain any weight during the weight loss trial.  Gaining weight will result in extension of weight loss trial.  3. Patient is also instructed to complete their labwork, psychological evaluation visit, and any other tests that the surgeon has ordered while they are working on their weight loss trial.  4.  Patient was instructed to bring their packet of nutrition education materials to every class and appointment.        Eating Habits and Behaviors    Reviewed intake  Understanding low carbohydrates, low sugar, higher protein meals  Instruction given for personal dietary changes  Discussed perceived compliance    Comments: RD Reviewed Diet History and Physical Activity/Exercise habits.  Recommended dietary changes discussed for both before and after surgery.     Today in class we reviewed  the Key Diet Principles.  Patient was encouraged to consume 3 meals each day, and meal timing was reviewed.  Meal time behaviors that will help pt to be successful with their weight loss efforts were also discussed.      We discussed the importance of drinking adequate amounts of fluids, recommending that patient consume a minimum of 64 oz of sugar-free, caffeine-free and carbonation-free fluids each day.  Patient was encouraged to eliminate sugar-sweetened beverages such as sweet tea, fruit juice, fruit smoothies, flavored coffee drinks and regular sodas.      During the weight loss trial, patient is encouraged to focus on eating protein-forward meals, with a daily goal of 80-100 grams protein.  Patient was also advised to decrease carbohydrate intake to <100 g/d, choosing complex vs. simple carbohydrates in limited amounts.  We also discussed limiting fat intake.  Encouraged patient to follow the 3-gram rule when choosing foods by looking for items containing <3 g of fat and sugar per serving.  We reviewed meal planning guidelines and discussed appropriate meal and snack options.  We also talked about appropriate protein drinks and patient was encouraged to start trying these, using them either for a meal replacement or a protein-rich snack pre-operatively.  We reviewed the nutritional guidelines for selecting protein shakes.      Pre-operative vitamin and  mineral supplementation was reviewed.  Patient was instructed to choose a chewable complete multivitamin with iron in NON-gummy form. Selection of probiotic was also reviewed.    Comments: During today's class we talked about making healthy dietary choices during the holidays.. Patient was instructed on:    Thoughtful and deliberate eating habits.  Patient was given the guidelines to keep their carbohydrates less than 50-75 grams per day in the pre-op phase.  Patient was also given ideas for substituting lower carbohydrate food options for typical  carbohydrate-heavy food options, such as carrot souffle for sweet potatoes.  Discussed planning and preparing for holiday meals through food ingredient substitutions in recipes.  Reviewed appropriate holiday beverages.  Suggestions for sweet treats that could be used in place of carbohydrate- and fat-heavy desserts typical during the holidays were provided.    Handouts Provided:  Bariatric Friendly Holiday Recipes  Holiday Eating Survival Guide    Patient's current diet habits include:  Patient is eating 3 meals and 2-3 snacks per day.   Pt has found 3 protein supplement shakes for use post-op in meeting their protein needs.    Patient's plan for dietary and/or behavior changes in the upcoming month: stop consuming the butternut squash soup and the broccoli cheddar soup.    Physical Activity/Exercise    Comments:  We talked about exercise.  Patient was given reasons of why exercise is so important and how that can help with their long-term success.  I have encouraged patient to get a support system to help with the activity.    We talked about recommended types of physical activity, including walking, swimming, cycling, or chair exercises.  I also talked with patient about doing some strength training, which helps the metabolism, as well as strengthen muscles and bones.  Patient's plan to incorporate more activity includes: I have decided to join a pilates class, at least 3-5 days/week.      Behavior Modification     Comments:  During today's class, we discussed the benefits of regular physical activity. Specific guidelines for cardiovascular exercise and resistance/strength training were reviewed, as well as appropriate types of physical activity.  Patient was directed to the handout, Overcoming Barriers to Exercise, where we reviewed the importance of making physical activity part of a healthy lifestyle rather than just a way of meeting a weight loss goal.  We also discussed  for specific tips on fitting  physical activity in when patients feel they are too busy to exercise and during inclement weather, as well as ideas for low/no cost exercise, and ways to exercise despite physical limitations.  Patient was instructed to discuss any physical limitations with their healthcare provider.  A list of ways to facilitate regular physical activity was reviewed, as well as how to get started with a regular physical activity regimen.      Goals:   Work to increase to 3-4 small meals per day, with 1-2 planned snacks as needed.  Recommend following the plate method for meal planning - focusing on lean protein, non-starchy vegetables, and measured amounts of complex carbohydrates.   - Goal of 80-100 g protein and <100 g carbohydrates per day.               - Select at least 2 DIFFERENT protein supplements that can be used for protein supplementation to meet goals pre- and post-operatively.   - Practice Carbohydrate Counting and label reading.   - Follow 3 g rule for fat and sugar.  2. Slow  down meals  - Chew each bite 25-35 times.  - Aim for 20-30 min/meal.  3. Increase non-caloric fluids to 64 oz per day.  Eliminate caffeine, added sugar, carbonation, and straws.               - Continue to work to decrease sugar sweetened beverages - goal of calorie-free beverages only.              - Must eliminate caffeine prior to surgery and avoid for ~6-8 weeks.   - Practice 30:30 rule, separating food and fluid intake.  4. Start activity regimen, work to increase ADLs.  5. Start Complete MVI, B-50/B-100 complex, and probiotic at least 30 days pre-op          "

## 2024-10-23 ENCOUNTER — Encounter

## 2024-10-24 ENCOUNTER — Encounter: Payer: PRIVATE HEALTH INSURANCE | Primary: Medical

## 2024-10-24 ENCOUNTER — Ambulatory Visit: Payer: PRIVATE HEALTH INSURANCE | Attending: Specialist | Primary: Medical

## 2024-10-25 ENCOUNTER — Inpatient Hospital Stay: Admit: 2024-11-02 | Discharge: 2024-11-02 | Payer: PRIVATE HEALTH INSURANCE | Primary: Medical

## 2024-10-25 NOTE — Progress Notes (Signed)
 "  Medical Weight Loss Multi-Disciplinary Program    Patient's Name: Monique Rogers Age: 38 y.o.   Date of Birth: 1986/09/16 Sex: female     Session #3. Pt attended in-person class.  Weight obtained in office.    Date: 10/25/2024    Vitals:    10/25/24 1050   Weight: 118.8 kg (261 lb 14.4 oz)   Height: 1.575 m (5' 2)      Body mass index is 47.9 kg/m.              Pounds Lost since last month: 3.1 lbs  Pounds Gained since last month: 0 lbs       Starting Weight: 258.3 lbs Previous Months Weight: 265 lbs   Overall Pounds Lost: 0 lbs Overall Pounds Gained: 3.6 lbs             Class Guidelines    Guidelines are reviewed with patient at the start of every class.    1. Patient understands that weight loss trial classes must be consecutive.  Patient understands if they miss a class, it is their responsibility to contact me to reschedule class.  I will reach out to patient after their first no show.  2.  Patient understands the expectations that weight maintenance/weight loss is expected during the classes.  Failure to demonstrate changes may result in extension of weight loss trial, followed by returning to see the surgeon.  Patient understands that they CANNOT gain any weight during the weight loss trial.  Gaining weight will result in extension of weight loss trial.  3. Patient is also instructed to complete their labwork, psychological evaluation visit, and any other tests that the surgeon has used while they are working on their weight loss trial.  4.  Patient was instructed to bring their packet of nutrition education materials to every class and appointment.        Eating Habits and Behaviors    Reviewed intake  Understanding low carbohydrates, low sugar, higher protein meals  Instruction given for personal dietary changes  Discussed perceived compliance    Comments: RD Reviewed Diet History and Physical Activity/Exercise habits.  Recommended dietary changes discussed for both before and after surgery.     Today in  class we reviewed the Key Diet Principles.  Patient was encouraged to consume 3 meals each day, and the timing the meals was reviewed.  Meal time behaviors that will help pt to be successful with their weight loss efforts were additionally reviewed.      We discussed the importance of drinking adequate amounts of fluids, recommending that patient consume a minimum of 64 oz of sugar-free, caffeine-free and carbonation-free fluids each day.  Patient was encouraged to eliminate sugar-sweetened beverages such as sweet tea, fruit juice, fruit smoothies, flavored coffee drinks and regular sodas.      During the weight loss trial, patient was encouraged to focus on eating protein-forward meals, with a daily goal of 80-100 grams protein.  Patient was also advised to decrease carbohydrate intake to <100 g/d, choosing complex vs. simple carbohydrates in limited amounts.  We also discussed limiting fat intake.  Encouraged patient to follow the 3-gram rule when choosing foods by looking for items containing <3 g of fat and sugar per serving.  We reviewed meal planning guidelines and discussed appropriate meal and snack options.  We also talked about protein drinks and patient was encouraged to start trying these, using them either for a meal replacement or a protein-rich snack.  We reviewed the nutritional guidelines for selecting protein shakes.      Pre-operative vitamin and mineral supplementation were reviewed.  Patient was instructed to choose chewable complete multivitamin with iron in NON-gummy form. Selection of probiotic was reviewed. Pt was additionally encouraged to start taking a B1 with at least 100 mg of thiamin or B100 Vitamin complex when starting multivitamin and probiotic at least 30 days pre-op.    Patient's current diet habits include:  Patient is eating 3-4 meals and 4-5 snacks per day.  Pt has found 2 protein supplements/shakes for use post-op in meeting their protein needs.    Patient's plan for dietary  and/or behavior changes in the upcoming month: continue to meal prep - not eating fast food has made such a difference.    Physical Activity/Exercise    Comments:    We talked about exercise.  Patient was given reasons of why exercise is so important and how that can help with their long-term success.  I have encouraged patient to get a support system to help with the activity.  Recommended that pt aim for 150 min/wk moderate to vigorous physical activity.    We talked about recommended types of physical activity, including walking, swimming, cycling, or chair exercises.  I also talked with patient about doing some strength training, which helps the metabolism, as well as strengthen muscles and bones.  Patient's plan to incorporate more activity includes: I plan to continue to walk and do Zumba, but I would like to strength train.    Behavior Modification     Comments:  During today's class, I discussed vitamin and mineral supplementation essential for health and prevention of malnutrition in depth.  Patient was directed to the handouts on vitamins and minerals found on pages 24-30 that were provided in class handouts packet as well as a handout titled, Nutrient Deficiency and it's symptoms.  I reviewed the vitamins and minerals that need to be supplemented, dosage recommendations, functions of supplements and signs of deficiencies, as well as examples of specific supplements.  Reviewed briefly the requirement  differences between laparoscopic gastric bypass, laparoscopic sleeve gastrectomy, and lap-band procedures, while encouraging all patients to continue supplements for life no matter what bariatric surgery they are preparing for.    Goals:   Work to increase to 3-4 small meals per day, with 1-2 planned snacks as needed.  Recommend following the plate method for meal planning - focusing on lean protein, non-starchy vegetables, and measured amounts of complex carbohydrates.   - Goal of 80-100 g protein and  <100 g carbohydrates per day.               - Select at least 2 DIFFERENT protein supplements that can be used for protein supplementation to meet goals pre- and post-operatively.   - Practice Carbohydrate Counting and label reading.   - Follow 3 g rule for fat and sugar.  2. Slow down meals  - Chew each bite 25-35 times.  - Aim for 20-30 min/meal.  3. Increase non-caloric fluids to 64 oz per day.  Eliminate caffeine, added sugar, carbonation, and straws.               - Continue to work to decrease sugar sweetened beverages - goal of calorie-free beverages only.              - Must eliminate caffeine prior to surgery and avoid for ~6-8 weeks.   - Practice 30:30 rule, separating food and fluid intake.  4. Start activity regimen, work to increase ADLs.  5. Start Complete MVI, B-50/B-100 complex, and probiotic at least 30 days pre-op    "

## 2024-11-23 ENCOUNTER — Encounter

## 2024-11-27 ENCOUNTER — Encounter

## 2024-11-28 ENCOUNTER — Ambulatory Visit: Admit: 2024-11-28 | Discharge: 2024-11-28 | Payer: PRIVATE HEALTH INSURANCE | Attending: Specialist | Primary: Medical

## 2024-11-28 ENCOUNTER — Inpatient Hospital Stay: Admit: 2024-11-28 | Discharge: 2024-11-28 | Payer: PRIVATE HEALTH INSURANCE | Primary: Medical

## 2024-11-28 ENCOUNTER — Inpatient Hospital Stay: Admit: 2024-11-28 | Payer: PRIVATE HEALTH INSURANCE | Primary: Medical

## 2024-11-28 VITALS — BP 143/78 | HR 66 | Temp 97.80000°F | Resp 18 | Ht 62.0 in | Wt 259.8 lb

## 2024-11-28 DIAGNOSIS — Z01818 Encounter for other preprocedural examination: Principal | ICD-10-CM

## 2024-11-28 MED ORDER — BARIUM SULFATE 96 % PO SUSR
96 | Freq: Once | ORAL | Status: AC | PRN
Start: 2024-11-28 — End: 2024-11-28
  Administered 2024-11-28: 18:00:00 100 mL via ORAL

## 2024-11-28 MED FILL — E-Z-PAQUE 96 % PO SUSR: 96 % | ORAL | Qty: 750

## 2024-11-28 NOTE — Telephone Encounter (Signed)
 Patient was contacted to discuss some important pre-op information and education in preparation for surgery.      Voicemail not set up

## 2024-11-28 NOTE — Progress Notes (Signed)
 [facility] UGI FOCUS NOTE      Date:  11/28/2024  Time:  1:18 PM    Patient:  Monique Rogers  Procedure:  UGI      Patient Questions  Lap Band Adjustment Patient Questionnaire:  If female, are you pregnant? [] Yes     [x] No  Tolerates thick liquids:  [x] Well   [] 1     [] 2     [] 3     [] 4     [] 5     [] Poorly  Tolerates red meat:  [x] Well   [] 1     [] 2     [] 3     [] 4     [] 5     []  Poorly  Tolerates chicken:  [x] Well   [] 1     [] 2     [] 3     [] 4     [] 5     [] Poorly  Tolerates fish:   [x] Well   [] 1     [] 2     [] 3     [] 4     [] 5     [] Poorly  Hunger is:   [x] Well Controlled     [] 1     [] 2     [] 3     [] 4     [] 5      []  Poorly Controlled  Nightime regurgitation:  [x] Never     [] 1     [] 2     [] 3     [] 4     [] 5     [] Frequently    Lap Band Info:  Band Type  [] Realize     [] Realize-C     [] AP     [] VG     [] 10cm     [] Unknown  [] Other      Comments:        Annabella JONELLE Jones, RN

## 2024-11-28 NOTE — Procedures (Signed)
Patient:Monique Rogers   DOB: 1986-08-10  Medical Record Number:[id number]            PREPROCEDURE DIAGNOSIS: This patient is preoperative for laparoscopic sleeve gastrectomy procedure with a history of  reflux disease.    POSTPROCEDURE DIAGNOSIS: This patient is preoperative for laparoscopic sleeve gastrectomy procedure with a history of  reflux disease.      PROCEDURES PERFORMED: Upper GI study with barium.    ESTIMATED BLOOD LOSS: None.    SPECIMENS: None.    STATEMENT OF MEDICAL NECESSITY: The patient is a patient with a  longstanding history of obesity. They are now considering the laparoscopic sleeve gastrectomy procedure as a means of surgical weight control and due to their history of reflux disease and are being assessed preoperatively for such.    DESCRIPTION OF PROCEDURE: The patient was brought to the fluoroscopy unit and  was given thin barium. On swallowing of barium, they were noted to have  normal peristalsis of their esophagus. They had prompt filling of distal  esophagus with tapering into the gastroesophageal junction. There was no evidence of a hiatal hernia present. Contrast then filled the gastric cardia, fundus,body and pre pyloric region with no abnormalities noted. Contrast then exited the pylorus in normal fashion. No obstruction was noted. There was no evidence of reflux noted.    (normal anatomy)    Angelique Holm, MD

## 2024-11-29 ENCOUNTER — Inpatient Hospital Stay: Admit: 2024-11-29 | Payer: PRIVATE HEALTH INSURANCE | Primary: Medical

## 2024-11-29 DIAGNOSIS — Z01812 Encounter for preprocedural laboratory examination: Principal | ICD-10-CM

## 2024-11-29 DIAGNOSIS — Z6841 Body Mass Index (BMI) 40.0 and over, adult: Secondary | ICD-10-CM

## 2024-11-29 LAB — TYPE AND SCREEN
ABO/Rh: O POS
Antibody Screen: NEGATIVE

## 2024-11-29 LAB — LABCORP SPECIMEN COLLECTION

## 2024-11-30 LAB — CBC WITH AUTO DIFFERENTIAL
Basophils %: 1 %
Basophils Absolute: 0 x10E3/uL (ref 0.0–0.2)
Eosinophils %: 4 %
Eosinophils Absolute: 0.2 x10E3/uL (ref 0.0–0.4)
Hematocrit: 39.3 % (ref 34.0–46.6)
Hemoglobin: 12.4 g/dL (ref 11.1–15.9)
Immature Grans (Abs): 0 x10E3/uL (ref 0.0–0.1)
Immature Granulocytes %: 0 %
Lymphocytes %: 32 %
Lymphocytes Absolute: 1.9 x10E3/uL (ref 0.7–3.1)
MCH: 25.2 pg — ABNORMAL LOW (ref 26.6–33.0)
MCHC: 31.6 g/dL (ref 31.5–35.7)
MCV: 80 fL (ref 79–97)
Monocytes %: 9 %
Monocytes Absolute: 0.5 x10E3/uL (ref 0.1–0.9)
Neutrophils %: 54 %
Neutrophils Absolute: 3.2 x10E3/uL (ref 1.4–7.0)
Platelets: 261 x10E3/uL (ref 150–450)
RBC: 4.92 x10E6/uL (ref 3.77–5.28)
RDW: 14.4 % (ref 11.7–15.4)
WBC: 5.9 x10E3/uL (ref 3.4–10.8)

## 2024-11-30 LAB — HCG, SERUM, QUALITATIVE: Preg, Serum: NEGATIVE m[IU]/mL

## 2024-11-30 LAB — COMPREHENSIVE METABOLIC PANEL
ALT: 24 IU/L (ref 0–32)
AST: 20 IU/L (ref 0–40)
Albumin: 4.5 g/dL (ref 3.9–4.9)
Alkaline Phosphatase: 77 IU/L (ref 41–116)
BUN/Creatinine Ratio: 15 (ref 9–23)
BUN: 16 mg/dL (ref 6–20)
CO2: 24 mmol/L (ref 20–29)
Calcium: 9.7 mg/dL (ref 8.7–10.2)
Chloride: 101 mmol/L (ref 96–106)
Creatinine: 1.04 mg/dL — ABNORMAL HIGH (ref 0.57–1.00)
Est, Glom Filt Rate: 71 mL/min/1.73
Globulin, Total: 3.2 g/dL (ref 1.5–4.5)
Glucose: 105 mg/dL — ABNORMAL HIGH (ref 70–99)
Potassium: 3.9 mmol/L (ref 3.5–5.2)
Sodium: 142 mmol/L (ref 134–144)
Total Bilirubin: 0.3 mg/dL (ref 0.0–1.2)
Total Protein: 7.7 g/dL (ref 6.0–8.5)

## 2024-12-01 LAB — EKG 12-LEAD
Atrial Rate: 71 {beats}/min
Diagnosis: NORMAL
P Axis: 79 degrees
P-R Interval: 156 ms
Q-T Interval: 378 ms
QRS Duration: 86 ms
QTc Calculation (Bazett): 410 ms
R Axis: 70 degrees
T Axis: 57 degrees
Ventricular Rate: 71 {beats}/min

## 2024-12-03 ENCOUNTER — Ambulatory Visit: Admit: 2024-12-03 | Discharge: 2024-12-03 | Payer: PRIVATE HEALTH INSURANCE | Attending: Specialist | Primary: Medical

## 2024-12-03 ENCOUNTER — Inpatient Hospital Stay: Admit: 2024-12-04 | Discharge: 2024-12-04 | Payer: PRIVATE HEALTH INSURANCE | Primary: Medical

## 2024-12-03 VITALS — BP 123/61 | HR 84 | Temp 97.20000°F | Resp 16 | Ht 62.0 in | Wt 262.6 lb

## 2024-12-03 DIAGNOSIS — G8918 Other acute postprocedural pain: Principal | ICD-10-CM

## 2024-12-03 MED ORDER — OXYCODONE-ACETAMINOPHEN 5-325 MG PO TABS
5-325 | ORAL_TABLET | Freq: Four times a day (QID) | ORAL | 0 refills | 7.00000 days | Status: AC | PRN
Start: 2024-12-03 — End: 2024-12-10

## 2024-12-03 NOTE — Progress Notes (Signed)
 Sleeve Gastrectomy - History and Physical    Subjective:     The patient is a 39 y.o. obese female with a Body mass index is 48.03 kg/m..   she presents now to review their work up to date to see if they are a candidate for surgery and whether or not to proceed with the previously requested procedure.  Bariatric comorbidities continue to include:   Patient Active Problem List   Diagnosis    Chronic low back pain    Morbid obesity (HCC)    Morbid obesity with BMI of 45.0-49.9, adult (HCC)    Asthma    Arthritis    Functional dyspepsia    Smoking history    Body mass index 45.0-49.9, adult (HCC)       They have been generally well prior to this visit and have had no recent significant illnesses. The patient has had no gastrointestinal issues that would preclude them from proceeding with the surgery they have chosen. Elva Mauro has recently tried a preoperative weight loss program  in addition to seeing a bariatric nutritionist preoperatively. We have discussed on at least one other occasion about the various types of surgical weight loss procedures and they have considered these options after our initial consultation. We have once again discussed these procedures in detail and they have now decided on a surgical procedure.  They present today to discuss this and confirm that their evaluation pre operatively is acceptable to continue with surgery.    The patient desires laparoscopic sleeve gastrectomy for surgical weight loss.      The patients goal weight is 160lb. (this represents a BMI of 28)    These goals are consistent with expected outcomes of their desired operation.  her Medical goals are resolution of these health issues.    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    Asthma     Functional dyspepsia     avoids trigger foods    Morbid obesity (HCC)     Morbid obesity with BMI of 45.0-49.9, adult (HCC)     Smoking history     quit after very passive use     Past Surgical History:   Procedure Laterality Date    BACK SURGERY      hardware in back 2007    BREAST REDUCTION SURGERY  2017    CESAREAN SECTION      X 1      Social History     Socioeconomic History    Marital status: Married     Spouse name: Not on file    Number of children: Not on file    Years of education: Not on file    Highest education level: Not on file   Occupational History    Not on file   Tobacco Use    Smoking status: Former     Current packs/day: 0.00     Types: Cigarettes     Quit date: 11/15/2010     Years since  quitting: 14.0    Smokeless tobacco: Never   Vaping Use    Vaping status: Never Used   Substance and Sexual Activity    Alcohol use: Yes     Comment: rare    Drug use: Never    Sexual activity: Yes     Partners: Male     Birth control/protection: None   Other Topics Concern    Not on file   Social History Narrative    Not on file     Social Drivers of Health     Financial Resource Strain: Not on file   Food Insecurity: Not on file   Transportation Needs: Not on file   Physical Activity: Not on file   Stress: Not on file   Social Connections: Not on file   Intimate Partner Violence: Not on file   Housing Stability: Not on file      family history includes Drug Abuse in her father; No Known Problems in her mother.     Current Outpatient Medications:     Bacillus Coagulans-Inulin (PROBIOTIC) 1-250 BILLION-MG CAPS, Take by mouth, Disp: , Rfl:     Cholecalciferol (VITAMIN D3) 50 MCG (2000 UT) CAPS, Take 1 capsule by mouth daily, Disp: , Rfl:     Multiple Vitamins-Minerals (THERAPEUTIC MULTIVITAMIN-MINERALS) tablet, Take 1 tablet by mouth daily, Disp: , Rfl:   Latex and Penicillins     Review  of Systems:     General - No history or complaints of unexpected fever, chills, or weight loss  Head/Neck - No history or complaints of headache, diplopia, dysphagia, hearing loss  Cardiac - No history or complaints of chest pain, palpitations, murmur, or shortness of breath  Pulmonary - No history or complaints of shortness of breath, productive cough, hemoptysis  Gastrointestinal - No history or complaints of reflux,  abdominal pain, obstipation/constipation, blood per rectum  Genitourinary - No history or complaints of hematuria/dysuria, stress urinary incontinence symptoms, or renal lithiasis  Musculoskeletal - No history or complaints of joint pain or muscular weakness  Hematologic - No history or complaints of bleeding disorders, blood transfusions, sickle cell anemia  Neurologic - No history or complaints of  migraine headaches, seizure activity, syncopal episodes, TIA or stroke  Integumentary - No history or complaints of rashes, abnormal nevi, skin cancer  Gynecological - normal menses    Objective:     BP 123/61 (BP Site: Right Upper Arm, Patient Position: Sitting, BP Cuff Size: Large Adult)   Pulse 84   Temp 97.2 F (36.2 C)   Resp 16   Ht 1.575 m (5' 2)   Wt 119.1 kg (262 lb 9.6 oz)   SpO2 100%   BMI 48.03 kg/m     Physical Examination: General appearance - alert, well appearing, and in no distress and oriented to person, place, and time  Mental status - alert, oriented to person, place, and time, normal mood, behavior, speech, dress, motor activity, and thought processes  Eyes - pupils equal and reactive, extraocular eye movements intact, sclera anicteric, left eye normal, right eye normal  Ears - right ear normal, left ear normal  Neck - supple, no significant adenopathy  Chest - clear to auscultation, no wheezes, rales or rhonchi, symmetric air entry  Heart - normal rate and regular rhythm  Abdomen - soft, nontender, nondistended, no masses or organomegaly  Back exam - full range of motion,  no tenderness, palpable spasm or pain on motion  Neurological - alert, oriented, normal speech, no  focal findings or movement disorder noted  Musculoskeletal - no joint tenderness, deformity or swelling  Extremities - no pedal edema noted    Labs / Preoperative Evaluation:        Recent Results (from the past 6 weeks)   CBC with Auto Differential    Collection Time: 11/29/24 12:00 AM   Result Value Ref Range    WBC 5.9 3.4 - 10.8 x10E3/uL    RBC 4.92 3.77 - 5.28 x10E6/uL    Hemoglobin 12.4 11.1 - 15.9 g/dL    Hematocrit 60.6 65.9 - 46.6 %    MCV 80 79 - 97 fL    MCH 25.2 (L) 26.6 - 33.0 pg    MCHC 31.6 31.5 - 35.7 g/dL    RDW 85.5 88.2 - 84.5 %    Platelets 261 150 - 450 x10E3/uL    Neutrophils % 54 Not Estab. %    Lymphocytes % 32 Not Estab. %    Monocytes % 9 Not Estab. %    Eosinophils % 4 Not Estab. %    Basophils % 1 Not Estab. %    Neutrophils Absolute 3.2 1.4 - 7.0 x10E3/uL    Lymphocytes Absolute 1.9 0.7 - 3.1 x10E3/uL    Monocytes Absolute 0.5 0.1 - 0.9 x10E3/uL    Eosinophils Absolute 0.2 0.0 - 0.4 x10E3/uL    Basophils Absolute 0.0 0.0 - 0.2 x10E3/uL    Immature Granulocytes % 0 Not Estab. %    Immature Grans (Abs) 0.0 0.0 - 0.1 x10E3/uL   Comprehensive Metabolic Panel    Collection Time: 11/29/24 12:00 AM   Result Value Ref Range    Glucose 105 (H) 70 - 99 mg/dL    BUN 16 6 - 20 mg/dL    Creatinine 8.95 (H) 0.57 - 1.00 mg/dL    Est, Glom Filt Rate 71 >59 mL/min/1.73    BUN/Creatinine Ratio 15 9 - 23    Sodium 142 134 - 144 mmol/L    Potassium 3.9 3.5 - 5.2 mmol/L    Chloride 101 96 - 106 mmol/L    CO2 24 20 - 29 mmol/L    Calcium 9.7 8.7 - 10.2 mg/dL    Total Protein 7.7 6.0 - 8.5 g/dL    Albumin 4.5 3.9 - 4.9 g/dL    Globulin, Total 3.2 1.5 - 4.5 g/dL    Total Bilirubin 0.3 0.0 - 1.2 mg/dL    Alkaline Phosphatase 77 41 - 116 IU/L    AST 20 0 - 40 IU/L    ALT 24 0 - 32 IU/L   HCG Qualitative, Serum    Collection Time: 11/29/24 12:00 AM   Result Value Ref Range    Preg, Serum Negative Negative <6 mIU/mL    EKG 12 Lead    Collection Time: 11/29/24  8:42 AM   Result Value Ref Range    Ventricular Rate 71 BPM    Atrial Rate 71 BPM    P-R Interval 156 ms    QRS Duration 86 ms    Q-T Interval 378 ms    QTc Calculation (Bazett) 410 ms    P Axis 79 degrees    R Axis 70 degrees    T Axis 57 degrees    Diagnosis       Normal sinus rhythm  Normal ECG  Confirmed by Alfredia MD, Henriette ([id number]) on 11/30/2024 10:56:47 PM     [facility] Specimen Collection    Collection Time: 11/29/24  8:55 AM   Result Value  Ref Range    [facility] Specimen Collection        Specimens collected/sent to [facility]. Please direct inquiries to [phone]).   TYPE AND SCREEN    Collection Time: 11/29/24  8:58 AM   Result Value Ref Range    Crossmatch expiration date 12/13/2024,2359     ABO/Rh O POSITIVE     Antibody Screen NEG        Assessment:     Morbid obesity with comorbidity    Plan:     laparoscopic sleeve gastrectomy    This is a 39 y.o. female with a BMI of Body mass index is 48.03 kg/m. and the weight-related co-morbidties as noted above. Forestine meets the NIH criteria for bariatric surgery based upon the BMI of Body mass index is 48.03 kg/m. and multiple weight-related co-morbidties. Maysoon has elected laparoscopic sleeve gastrectomy as her intervention of choice for treatment of morbid obestiy through surgical means secondary to its safety profile, rapid return to work  and decreases in operative risks over gastric bypass.    In the office today, following Kaiah's history and physical examination, a 40 minute discussion regarding the anatomic alterations for the laparoscopic sleeve gastrectomy was undertaken. The dietary expectations and the patient  dependent factors for success were thoroughly discussed, to include the need for interval follow-up and long-term dietary changes associated with success. The possible complications of the sleeve gastrectomy  were also discussed, to include;death, DVT/PE, staple line leak, bleeding, stricture  formation, infection, nutritional deficiencies and sleeve dilation.  Specific weight related outcomes for success were also discussed with an emphasis on careful and close follow-up with the first year and eating behavior modification as the baseline and cyclical hunger return.  The patient expressed an understanding of the above factors, and her questions were answered in their entirety.    In addition, the patient attended a 1.5 hour power point seminar regarding obesity, surgical weight loss including, adjustable gastric band, gastric bypass, and sleeve gastrectomy.  This discussion contrasted the different surgical techniques, mechanisms of actions and expected outcomes, and surgical and medical risks associated with each procedure.  During this seminar, there was a long question and answer session where each questions was answered until there were no additional questions.     Today, the patient had all of her questions answered and the decision was made today that the patient's preoperative evaluation is acceptable for them  to proceed with bariatric surgery  choosing the sleeve gastrectomy as her surgical option.    The patient understands the plan of action        Secondary Diagnoses:     DVT / Pulmonary Embolus Risk - The patient is at a higher risk for post operative DVT / pulmonary embolus secondary to their morbid obese status, relative sedentary lifestyle, and impending general anesthetic.  We will plan to use anticoagulation therapy pre and post operative as well as  pneumatic compression devices and encourage ambulation once on the hospital nursing floor.  The need for possible at home anticoagulation therapy has also been discussed and any decision on this matter will be made during post operative evaluations. The patient understands that their efforts at ambulation are of vital importance to reduce the risk of this complication thus placing significant burden on them as to the prevention of such  issues. Signs and symptoms of DVT / PE have been discussed with the patient and they have been instructed to call the office if any these occur in the  at home post op phase     Restrictive Airway Disease - We will continue all of their pulmonary medications in the form  of oral pills and inhalers in both the perioperative and postoperative period. They understand that   their symptoms should improve with weight loss. Any further testing related to this will be turned over   to their family physician or pulmonologist. The patient  understands that if they require oral or IV steroids in the future that they will notify us .  This is particularly important for gastric bypass patients at all times and both sleeve  gastrectomy and gastric bypass patients in the 1 month pre op and 1 month post operative period.   They understand that inhaled steroids are exempt from this.      Weight Related Arthritis -The patient understands the benefits that weight loss surgery can have on their arthritis but also understands that weight loss is not a guaranteed cure and relief of symptoms is often dependent on the severity of the underlying disease.  The patient also understands that traditional pharmaceutical treatments for this diagnosis are usually unavailable to post-operative weight loss patients due to the effects on the gastrointestinal tract particularly with the gastric bypass and to a lesser effect with the sleeve gastrectomy.  Any changes to the patients medication treatment will ultimately be made the patients PCP with input by our office.      Signed By: Curtistine JONETTA Bass, MD     December 03, 2024         Total time spent with the patient's chart reviewing their history, prior workup, prior surgeries, and performing a physical examination in addition to discussing the surgery at hand was 45 minutes

## 2024-12-03 NOTE — H&P (View-Only) (Signed)
 Sleeve Gastrectomy - History and Physical    Subjective:     The patient is a 39 y.o. obese female with a Body mass index is 48.03 kg/m..   she presents now to review their work up to date to see if they are a candidate for surgery and whether or not to proceed with the previously requested procedure.  Bariatric comorbidities continue to include:   Patient Active Problem List   Diagnosis    Chronic low back pain    Morbid obesity (HCC)    Morbid obesity with BMI of 45.0-49.9, adult (HCC)    Asthma    Arthritis    Functional dyspepsia    Smoking history    Body mass index 45.0-49.9, adult (HCC)       They have been generally well prior to this visit and have had no recent significant illnesses. The patient has had no gastrointestinal issues that would preclude them from proceeding with the surgery they have chosen. Monique Rogers has recently tried a preoperative weight loss program  in addition to seeing a bariatric nutritionist preoperatively. We have discussed on at least one other occasion about the various types of surgical weight loss procedures and they have considered these options after our initial consultation. We have once again discussed these procedures in detail and they have now decided on a surgical procedure.  They present today to discuss this and confirm that their evaluation pre operatively is acceptable to continue with surgery.    The patient desires laparoscopic sleeve gastrectomy for surgical weight loss.      The patients goal weight is 160lb. (this represents a BMI of 28)    These goals are consistent with expected outcomes of their desired operation.  her Medical goals are resolution of these health issues.    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    Asthma     Functional dyspepsia     avoids trigger foods    Morbid obesity (HCC)     Morbid obesity with BMI of 45.0-49.9, adult (HCC)     Smoking history     quit after very passive use     Past Surgical History:   Procedure Laterality Date    BACK SURGERY      hardware in back 2007    BREAST REDUCTION SURGERY  2017    CESAREAN SECTION      X 1      Social History     Socioeconomic History    Marital status: Married     Spouse name: Not on file    Number of children: Not on file    Years of education: Not on file    Highest education level: Not on file   Occupational History    Not on file   Tobacco Use    Smoking status: Former     Current packs/day: 0.00     Types: Cigarettes     Quit date: 11/15/2010     Years since  quitting: 14.0    Smokeless tobacco: Never   Vaping Use    Vaping status: Never Used   Substance and Sexual Activity    Alcohol use: Yes     Comment: rare    Drug use: Never    Sexual activity: Yes     Partners: Male     Birth control/protection: None   Other Topics Concern    Not on file   Social History Narrative    Not on file     Social Drivers of Health     Financial Resource Strain: Not on file   Food Insecurity: Not on file   Transportation Needs: Not on file   Physical Activity: Not on file   Stress: Not on file   Social Connections: Not on file   Intimate Partner Violence: Not on file   Housing Stability: Not on file      family history includes Drug Abuse in her father; No Known Problems in her mother.     Current Outpatient Medications:     Bacillus Coagulans-Inulin (PROBIOTIC) 1-250 BILLION-MG CAPS, Take by mouth, Disp: , Rfl:     Cholecalciferol (VITAMIN D3) 50 MCG (2000 UT) CAPS, Take 1 capsule by mouth daily, Disp: , Rfl:     Multiple Vitamins-Minerals (THERAPEUTIC MULTIVITAMIN-MINERALS) tablet, Take 1 tablet by mouth daily, Disp: , Rfl:   Latex and Penicillins     Review  of Systems:     General - No history or complaints of unexpected fever, chills, or weight loss  Head/Neck - No history or complaints of headache, diplopia, dysphagia, hearing loss  Cardiac - No history or complaints of chest pain, palpitations, murmur, or shortness of breath  Pulmonary - No history or complaints of shortness of breath, productive cough, hemoptysis  Gastrointestinal - No history or complaints of reflux,  abdominal pain, obstipation/constipation, blood per rectum  Genitourinary - No history or complaints of hematuria/dysuria, stress urinary incontinence symptoms, or renal lithiasis  Musculoskeletal - No history or complaints of joint pain or muscular weakness  Hematologic - No history or complaints of bleeding disorders, blood transfusions, sickle cell anemia  Neurologic - No history or complaints of  migraine headaches, seizure activity, syncopal episodes, TIA or stroke  Integumentary - No history or complaints of rashes, abnormal nevi, skin cancer  Gynecological - normal menses    Objective:     BP 123/61 (BP Site: Right Upper Arm, Patient Position: Sitting, BP Cuff Size: Large Adult)   Pulse 84   Temp 97.2 F (36.2 C)   Resp 16   Ht 1.575 m (5' 2)   Wt 119.1 kg (262 lb 9.6 oz)   SpO2 100%   BMI 48.03 kg/m     Physical Examination: General appearance - alert, well appearing, and in no distress and oriented to person, place, and time  Mental status - alert, oriented to person, place, and time, normal mood, behavior, speech, dress, motor activity, and thought processes  Eyes - pupils equal and reactive, extraocular eye movements intact, sclera anicteric, left eye normal, right eye normal  Ears - right ear normal, left ear normal  Neck - supple, no significant adenopathy  Chest - clear to auscultation, no wheezes, rales or rhonchi, symmetric air entry  Heart - normal rate and regular rhythm  Abdomen - soft, nontender, nondistended, no masses or organomegaly  Back exam - full range of motion,  no tenderness, palpable spasm or pain on motion  Neurological - alert, oriented, normal speech, no  focal findings or movement disorder noted  Musculoskeletal - no joint tenderness, deformity or swelling  Extremities - no pedal edema noted    Labs / Preoperative Evaluation:        Recent Results (from the past 6 weeks)   CBC with Auto Differential    Collection Time: 11/29/24 12:00 AM   Result Value Ref Range    WBC 5.9 3.4 - 10.8 x10E3/uL    RBC 4.92 3.77 - 5.28 x10E6/uL    Hemoglobin 12.4 11.1 - 15.9 g/dL    Hematocrit 60.6 65.9 - 46.6 %    MCV 80 79 - 97 fL    MCH 25.2 (L) 26.6 - 33.0 pg    MCHC 31.6 31.5 - 35.7 g/dL    RDW 85.5 88.2 - 84.5 %    Platelets 261 150 - 450 x10E3/uL    Neutrophils % 54 Not Estab. %    Lymphocytes % 32 Not Estab. %    Monocytes % 9 Not Estab. %    Eosinophils % 4 Not Estab. %    Basophils % 1 Not Estab. %    Neutrophils Absolute 3.2 1.4 - 7.0 x10E3/uL    Lymphocytes Absolute 1.9 0.7 - 3.1 x10E3/uL    Monocytes Absolute 0.5 0.1 - 0.9 x10E3/uL    Eosinophils Absolute 0.2 0.0 - 0.4 x10E3/uL    Basophils Absolute 0.0 0.0 - 0.2 x10E3/uL    Immature Granulocytes % 0 Not Estab. %    Immature Grans (Abs) 0.0 0.0 - 0.1 x10E3/uL   Comprehensive Metabolic Panel    Collection Time: 11/29/24 12:00 AM   Result Value Ref Range    Glucose 105 (H) 70 - 99 mg/dL    BUN 16 6 - 20 mg/dL    Creatinine 8.95 (H) 0.57 - 1.00 mg/dL    Est, Glom Filt Rate 71 >59 mL/min/1.73    BUN/Creatinine Ratio 15 9 - 23    Sodium 142 134 - 144 mmol/L    Potassium 3.9 3.5 - 5.2 mmol/L    Chloride 101 96 - 106 mmol/L    CO2 24 20 - 29 mmol/L    Calcium 9.7 8.7 - 10.2 mg/dL    Total Protein 7.7 6.0 - 8.5 g/dL    Albumin 4.5 3.9 - 4.9 g/dL    Globulin, Total 3.2 1.5 - 4.5 g/dL    Total Bilirubin 0.3 0.0 - 1.2 mg/dL    Alkaline Phosphatase 77 41 - 116 IU/L    AST 20 0 - 40 IU/L    ALT 24 0 - 32 IU/L   HCG Qualitative, Serum    Collection Time: 11/29/24 12:00 AM   Result Value Ref Range    Preg, Serum Negative Negative <6 mIU/mL    EKG 12 Lead    Collection Time: 11/29/24  8:42 AM   Result Value Ref Range    Ventricular Rate 71 BPM    Atrial Rate 71 BPM    P-R Interval 156 ms    QRS Duration 86 ms    Q-T Interval 378 ms    QTc Calculation (Bazett) 410 ms    P Axis 79 degrees    R Axis 70 degrees    T Axis 57 degrees    Diagnosis       Normal sinus rhythm  Normal ECG  Confirmed by Alfredia MD, Henriette ([id number]) on 11/30/2024 10:56:47 PM     [facility] Specimen Collection    Collection Time: 11/29/24  8:55 AM   Result Value  Ref Range    [facility] Specimen Collection        Specimens collected/sent to [facility]. Please direct inquiries to [phone]).   TYPE AND SCREEN    Collection Time: 11/29/24  8:58 AM   Result Value Ref Range    Crossmatch expiration date 12/13/2024,2359     ABO/Rh O POSITIVE     Antibody Screen NEG        Assessment:     Morbid obesity with comorbidity    Plan:     laparoscopic sleeve gastrectomy    This is a 39 y.o. female with a BMI of Body mass index is 48.03 kg/m. and the weight-related co-morbidties as noted above. Monique Rogers meets the NIH criteria for bariatric surgery based upon the BMI of Body mass index is 48.03 kg/m. and multiple weight-related co-morbidties. Monique Rogers has elected laparoscopic sleeve gastrectomy as her intervention of choice for treatment of morbid obestiy through surgical means secondary to its safety profile, rapid return to work  and decreases in operative risks over gastric bypass.    In the office today, following Monique Rogers's history and physical examination, a 40 minute discussion regarding the anatomic alterations for the laparoscopic sleeve gastrectomy was undertaken. The dietary expectations and the patient  dependent factors for success were thoroughly discussed, to include the need for interval follow-up and long-term dietary changes associated with success. The possible complications of the sleeve gastrectomy  were also discussed, to include;death, DVT/PE, staple line leak, bleeding, stricture  formation, infection, nutritional deficiencies and sleeve dilation.  Specific weight related outcomes for success were also discussed with an emphasis on careful and close follow-up with the first year and eating behavior modification as the baseline and cyclical hunger return.  The patient expressed an understanding of the above factors, and her questions were answered in their entirety.    In addition, the patient attended a 1.5 hour power point seminar regarding obesity, surgical weight loss including, adjustable gastric band, gastric bypass, and sleeve gastrectomy.  This discussion contrasted the different surgical techniques, mechanisms of actions and expected outcomes, and surgical and medical risks associated with each procedure.  During this seminar, there was a long question and answer session where each questions was answered until there were no additional questions.     Today, the patient had all of her questions answered and the decision was made today that the patient's preoperative evaluation is acceptable for them  to proceed with bariatric surgery  choosing the sleeve gastrectomy as her surgical option.    The patient understands the plan of action        Secondary Diagnoses:     DVT / Pulmonary Embolus Risk - The patient is at a higher risk for post operative DVT / pulmonary embolus secondary to their morbid obese status, relative sedentary lifestyle, and impending general anesthetic.  We will plan to use anticoagulation therapy pre and post operative as well as  pneumatic compression devices and encourage ambulation once on the hospital nursing floor.  The need for possible at home anticoagulation therapy has also been discussed and any decision on this matter will be made during post operative evaluations. The patient understands that their efforts at ambulation are of vital importance to reduce the risk of this complication thus placing significant burden on them as to the prevention of such  issues. Signs and symptoms of DVT / PE have been discussed with the patient and they have been instructed to call the office if any these occur in the  at home post op phase     Restrictive Airway Disease - We will continue all of their pulmonary medications in the form  of oral pills and inhalers in both the perioperative and postoperative period. They understand that   their symptoms should improve with weight loss. Any further testing related to this will be turned over   to their family physician or pulmonologist. The patient  understands that if they require oral or IV steroids in the future that they will notify us .  This is particularly important for gastric bypass patients at all times and both sleeve  gastrectomy and gastric bypass patients in the 1 month pre op and 1 month post operative period.   They understand that inhaled steroids are exempt from this.      Weight Related Arthritis -The patient understands the benefits that weight loss surgery can have on their arthritis but also understands that weight loss is not a guaranteed cure and relief of symptoms is often dependent on the severity of the underlying disease.  The patient also understands that traditional pharmaceutical treatments for this diagnosis are usually unavailable to post-operative weight loss patients due to the effects on the gastrointestinal tract particularly with the gastric bypass and to a lesser effect with the sleeve gastrectomy.  Any changes to the patients medication treatment will ultimately be made the patients PCP with input by our office.      Signed By: Curtistine JONETTA Bass, MD     December 03, 2024         Total time spent with the patient's chart reviewing their history, prior workup, prior surgeries, and performing a physical examination in addition to discussing the surgery at hand was 45 minutes

## 2024-12-03 NOTE — Periop Note (Signed)
"        Pre-Admission Testing Algorithm for Identification of Special Population for Anesthesia Review                                                            Patient History Triggers That Require Anesthesia Review & Potential Clearance      [] Anesthesia Reaction History: Malignant Hyperthermia (including family hx of MH) and Pseudocholinesterase Deficiency  [] Arrhythmias (Significant) or Conduction abnormalities (e.g., A-Fib, A-Flutter, SVT, PSVT, VT, V-Fib, Bradycardia with HR < 40, Sick Sinus Syndrome, Bi/Trigeminy, PACs/PVCs and accessory pathway tachycardias such as Wolf-Parkinson-White)  [] Cardiac History: CHF, CAD, Heart Valve Disorders (Aortic Stenosis, Mitral Regurgitation and Atresia), Cardiomyopathy, Aneurysms, All Ischemia, LBBB, Septal Defects, New/Recent Infarcts and Angina, and Pacemakers/AICDs-  [] Pulmonary History: Severe COPD: Emphysema/Chronic Bronchitis (recent exacerbation within 6 weeks of surgery), Pulmonary Fibrosis, Pulmonary HTN, and on Home Oxygen  [] Recent URI within 30 days of surgery (for example: Covid, pneumonia, bronchitis, etc.)  [] Kidney History: Chronic Renal Failure, on Hemodialysis or Peritoneal Dialysis  [] Hepatic History: Liver Failure, Cirrhosis, Ascites  [] HgA1c > 8.0 or on insulin pump  [] Hgb < 10  [] CVA/Stroke History/Tia  [] Seizure/Epilepsy History  [] PVD/PAD  [] Head and Neck Cancers History: Chemotherapy/Radiation Treatment  [] Bleeding and Clotting Disorders: Hemophilia, Von Willebrand (DDAVP, Factor V Leiden, Factor Deficiencies Disorders, Thrombocytopenia, DIC, Sickle Cell Disease, Hereditary Hemorrhagic Telangiectasia (HHT) and Prothrombin Gene Mutation  [] Rare Diseases: Cystic Fibrosis, Muscular Dystrophies, Phenylketonuria (PKU), Achalasia, etc  [] Autoimmune Diseases(to include the following): Type-1 Diabetes, RA, Lupus, Multiple Sclerosis, Addison's and Graves's Diseases, Hashimoto's Thyroiditis, Myasthenia Gravis, Scleroderma, Sjogren's Syndrome, Pernicious  Anemia, Autoimmune Vasculitis  [] Transplant History: Organ or Bone Marrow   [] Abnormal Labs: Elevated Creatinine, Elevated Liver Enzymes, Elevated and Low Potassium and Sodium, Low Platelets   [] Blood Clots History: DVT/PE (if treated with anticoagulation- request instructions for Pre-Op management)-Do Not Require Anesthesia Review  [] If BMI 50 or greater, then sent chart to anesthesia for review for possible airway check.  [x] Patient Does not meet Spec Pop Criteria  "

## 2024-12-03 NOTE — Periop Note (Signed)
 PCP is aware of the surgery. Instructed to bring Insurance card and ID on the day of surgery.  Advance directives status: none     SURGICAL PRE-ADMISSION INSTRUCTIONS    Your Medication instructions:    Reviewed and gave medications recommendations below per anesthesia guidelines. Advised patient to Check with Dr. Ima and follow his instructions for all medications. Patient verbalized understanding.     Follow MD instructions for pre-op management of:  probiotic, vitamin D, multivitamin    Follow surgeon's instructions for stopping NSAIDs (i.e. Ibuprofen/Motrin, Advil, BC powder, Aleve/Naproxen, Voltaren cream or tablet, Mobic /Meloxicam , Excedrin, Celebrex, Ketorolac/Toradol, etc). If no instructions provided stop NSAIDs 7 days prior to surgery.    Stop supplements and vitamins (except multivitamins) 2 weeks prior to surgery or now if surgery within 2 weeks of PAT Phone interview.    Patient denies being on any inhalers, eye drops, ointment/lotions/gels, patches, injectable medications, phentermine,  and herbals at this time. Instructed patient to please notify Preanesthesia Testing at [phone] with any medication changes after the PAT phone appointment.    General Day Surgery Instructions    Do NOT eat or drink anything, including water, ice chips, soda, candy, or gum after MIDNIGHT on the night before surgery, unless you have specific instructions from your Surgeon or Anesthesia Provider to do so.   Please follow instructions for cleaning skin with CHG wash or antibacterial bar soap kit 3 days prior to surgery as directed.  CHG wipes the night before surgery as directed.  Please shower with antibacterial bar soap prior to arrival on day of surgery.  No smoking/vaping/chewing tobacco products 24 hours before surgery.  No recreational drugs for one week prior to the day of surgery.  Remove all jewelry/body piercing, nail polish, makeup (including mascara); no lotions, powders, deodorant, or  perfume/cologne/after shave.  Leave all valuables, including money/purse/wallet, jewelry, laptop, etc.. at home.  Bring durable medical equipment (DME) needed: none  Glasses/Contact lenses, Hearing aids, and Dentures may be worn to the hospital.  They will be removed prior to surgery.  Call your doctor if you have symptoms of a cold or illness, rash, wounds, or open areas on your skin prior to surgery. Patient denies any symptoms of illnesses and no open skin areas/rashes/wounds at this time.  More than one visitor is allowed in the waiting room of the [facility] on the day of surgery; however, only 1 visitor is allowed to accompany you in the clinical areas.  AN ADULT MUST DRIVE YOU HOME AFTER OUTPATIENT SURGERY.   If you are having an OUTPATIENT procedure, please make arrangements for a responsible adult to be with you for 24 hours after your surgery.  If you are admitted to the hospital, you will be assigned to a bed after surgery is complete.  Please anticipate being here at the Hospital A MINIMUM of 5-6 hours on the Surgery Day. Please DO NOT make any other appointment for the day of surgery. The staff will inform you and your family if any delays occur on the day of surgery. Please ensure that your designated driver or ride remains on campus for the entire duration of your stay on the day of surgery. This is important for your safety and timely discharge.     Arrival time will be provided to you by your surgeon's office on the day before your procedure/surgery. Normally you will be asked to arrive 2-3 Hours in advance of your procedure.    On the day of surgery, please  arrive at [facility] off of [address] at the back of the [facility] (two story red brick building, located across the emergency room) or at the [facility], off of [address]    Patient verbalized understanding of instructions provided during pre-anesthesia phone interview.     Patient stated  she does not have a DNR order. Patient stated she does not have any research studies, clinical trials, prosthetics, or implantable devices. Patient stated she does not have any GLP-1's. Confirmed accuracy of home medications list including care everywhere and reconcile outside medications. Patient stated she does not have any further medications outside of home medications list. Patient sees Ozell Mages (PCP) and Lonni Rome (orthopedics).    Sent above surgical instructions to patient via MyChart message. Encouraged patient to check MyChart for the written surgery instructions.

## 2024-12-03 NOTE — Progress Notes (Signed)
 CLINICAL NUTRITION PRE-OPERATIVE EDUCATION    Patient's Name: Thania Woodlief Age: 39 y.o.   Date of Birth: 1986/04/06 Sex: female       Education & Materials Provided - Post-op Binder to include:  Liquid Diet Hotel Manager Guide: MVI, Vitamin B12, Calcium Citrate, Vitamin D, Vitamin B50, and Iron recommendations  Protein Supplement Information   Fluid Requirements/No Straws  No Caffeine or Carbonation   No Alcohol                               No Snacks or No Concentrated Sweets                                   Exercise Guidelines   Key Diet Principles                            Addressed Current Habits/Changes to Make   Patient was instructed on clear liquid diet guidelines to follow for one (1) day prior to surgery.  Patient has been educated on the liquid diet to begin day 2 post-op and continue for 2 weeks  Patient understands the timeline for diet progression and the need to remain on full liquid diet until advanced by provider and dietitian.               Summary:  Patient has completed the required visits with the Registered Dietitian.  During these nutrition visits, we focused on dietary changes, behavior modifications, and the importance of establishing an exercise routine.  The pre-op diet protocol that patient was prescribed emphasized low carbohydrate intake (less than 50 grams per day) and 60-80 grams (g) of protein per day.     At today's session, patient was educated on the post-op diet and vitamin/mineral protocols.  Patient understands the importance of keeping total fat and sugar intake to less than 3g per serving.  Patient is aware of the the timeline for the different stages of the post-op diet and is aware that they will be on a modified full liquid diet for 2 weeks post-op.  Patient understands that the body needs ~60-70 g/d protein.  During the liquid diet phase, patient will need a minimum of 60 g/d protein from supplemental shakes.  Once eating soft protein-rich foods,  patient will need 40-60 g/d protein from supplemental shakes to meet the total daily intake goal of 80-100 g/d protein.  Patient understands the importance of being compliant with the diet and vitamin/mineral protocols, as well as the complications and risks that can occur if they are non-compliant.     Patient has also been educated on the pre-op clear liquid diet protocol for 1 day prior to surgery.  Patient understands that failure to comply with following the pre-op clear liquid diet could result in surgery being canceled.     Patient's virtual appointment for 2 week post-op nutrition education has been scheduled.  At this 2 week post-op visit, RD will assess how patient is tolerating liquids.  If patient is tolerating liquid diet without difficulty, RD will recommend advancement of patient's diet to soft/moist (puree) diet to provider.  Patient will also see RD again at 84-month post-op to assess tolerance of soft/moist diet and advance to soft protein with soft vegetables, if soft/moist diet is tolerated without difficulty.  RD will  assess patient's compliance with current protocol, including diet, vitamins/minerals, protein shakes, and physical activity.  Post-op diet guidelines will be reinforced.  Patient provided with RD contact information, and RD is available for questions and to meet with patient outside of the 2 week and 1 month post-op visit.    Paulino Cork B Trammell Bowden, RD

## 2024-12-04 MED ORDER — ENOXAPARIN SODIUM 40 MG/0.4ML IJ SOSY
40 | INTRAMUSCULAR | 0 refills | 7.00000 days | Status: AC
Start: 2024-12-04 — End: ?

## 2024-12-04 MED ORDER — ONDANSETRON 8 MG PO TBDP
8 | ORAL_TABLET | Freq: Three times a day (TID) | ORAL | 0 refills | 7.00000 days | Status: AC | PRN
Start: 2024-12-04 — End: ?

## 2024-12-04 NOTE — Telephone Encounter (Signed)
 Patient attended bariatric surgery pre-operative education class today yesterday. Patient watched the medical Power Presentation.  Patient was given a bariatric binder of resources; form acknowledging receipt of said book was completed.      Lovenox injections and Zofran were sent to patient's pharmacy on file today.

## 2024-12-04 NOTE — Telephone Encounter (Signed)
 Patient was contacted to discuss some important pre-op information and education in preparation for surgery.       Diabetes:  Have you been testing your blood sugar levels at home, and what have they been the past few days?  No     If they are not significantly lower than 200 the entire month prior to surgery, you need to let us  know and contact your family physician for an appointment.    GLP 1 Injectables:  Are you currently taking any injectables for diabetes or weight loss?  Examples include: Semaglutide/Ozempic/Wegovy, Liraglutide/Victoza/Saxenda, Tirzepatide/Zepbound     No     These injectables will need to be stopped one week prior to surgery.  If you are taking it for diabetes, you can not have any carbohydrates the week after stopping the medication.  Ensure you are eating only protein sources.  Your provider will review your medications to determine if you need to continue it after surgery.      Phentermine (Fastin):  Are you currently taking Phentermine (Fastin) for weight loss?  No    Hypertension:  Are you monitoring your blood pressure at home, and what has it been?  No     If your blood pressure has not been well-controlled with a systolic pressure lower than  839 and a diastolic pressure lower than 100, you need to contact your family physician for an appointment.    Steroids (oral and/or injectable):  Have you recently taken any oral steroids such as Prednisone or a Medrol dose pack for a respiratory infection or COVID? No     Have you had any steroid injections such as a cortisone injection in your back, knee, foot, or any other part of your body?  No     You can not have any oral steroids and/or steroid injections 45 days prior to surgery.      NSAIDS:  Have you recently taken any NSAIDS such as Mobic , Aleve, Naprosyn, Motrin, BC or Goody's powder?  Pt is aware     NSAIDS must be stopped 10 days prior to surgery.  If you are currently taking aspirin or Plavix that a physician has prescribed,  continue to take it as prescribed, and a provider in our office will advise you during your pre-op appointment.    Birth Control:  Are you currently taking any form of birth control?    No     You need to stop taking it two weeks prior to surgery and can start back two weeks after surgery.  You will need to use another form of protection during this time, such as condoms to avoid pregancy.  If you have an IUD or implant as a form of birth control, it is okay, and you may continue.    Medication:  Have you had any significant changes to any medication? No changes

## 2024-12-05 ENCOUNTER — Ambulatory Visit: Payer: PRIVATE HEALTH INSURANCE | Attending: Specialist | Primary: Medical

## 2024-12-11 ENCOUNTER — Ambulatory Visit: Admit: 2024-12-11 | Discharge: 2024-12-11 | Payer: PRIVATE HEALTH INSURANCE | Attending: Specialist | Primary: Medical

## 2024-12-11 ENCOUNTER — Observation Stay
Admission: RE | Admit: 2024-12-11 | Discharge: 2024-12-12 | Disposition: A | Discharge status: Home / Self Care | Payer: PRIVATE HEALTH INSURANCE | Attending: Specialist | Admitting: Specialist

## 2024-12-11 DIAGNOSIS — Z6841 Body Mass Index (BMI) 40.0 and over, adult: Principal | ICD-10-CM

## 2024-12-11 LAB — POC PREGNANCY UR-QUAL: Preg Test, Ur: NEGATIVE

## 2024-12-11 MED ORDER — ENOXAPARIN SODIUM 40 MG/0.4ML IJ SOSY
40 | Freq: Two times a day (BID) | INTRAMUSCULAR | Status: DC
Start: 2024-12-11 — End: 2024-12-12
  Administered 2024-12-12 (×2): 40 mg via SUBCUTANEOUS

## 2024-12-11 MED ORDER — HYDROMORPHONE HCL 1 MG/ML IJ SOLN
1 | INTRAMUSCULAR | Status: DC | PRN
Start: 2024-12-11 — End: 2024-12-11

## 2024-12-11 MED ORDER — BUPIVACAINE-EPINEPHRINE (PF) 0.25% -1:200000 IJ SOLN
INTRAMUSCULAR | Status: DC | PRN
Start: 2024-12-11 — End: 2024-12-11
  Administered 2024-12-11: 15:00:00 7 via INTRAMUSCULAR
  Administered 2024-12-11: 15:00:00 10 via INTRAMUSCULAR

## 2024-12-11 MED ORDER — ONDANSETRON HCL 4 MG/2ML IJ SOLN
4 | Freq: Once | INTRAMUSCULAR | Status: AC | PRN
Start: 2024-12-11 — End: 2024-12-11
  Administered 2024-12-11: 16:00:00 4 mg via INTRAVENOUS

## 2024-12-11 MED ORDER — NORMAL SALINE FLUSH 0.9 % IV SOLN
0.9 | Freq: Two times a day (BID) | INTRAVENOUS | Status: DC
Start: 2024-12-11 — End: 2024-12-12
  Administered 2024-12-12: 14:00:00 10 mL via INTRAVENOUS

## 2024-12-11 MED ORDER — BUPIVACAINE-EPINEPHRINE (PF) 0.25% -1:200000 IJ SOLN
INTRAMUSCULAR | Status: AC
Start: 2024-12-11 — End: 2024-12-11

## 2024-12-11 MED ORDER — HYDROMORPHONE HCL 2 MG/ML IJ SOLN
2 | Freq: Once | INTRAMUSCULAR | Status: DC | PRN
Start: 2024-12-11 — End: 2024-12-11
  Administered 2024-12-11 (×2): 1 via INTRAVENOUS

## 2024-12-11 MED ORDER — GLYCOPYRROLATE 0.4 MG/2ML IJ SOLN
0.4 | INTRAMUSCULAR | Status: AC
Start: 2024-12-11 — End: 2024-12-11

## 2024-12-11 MED ORDER — ONDANSETRON 4 MG PO TBDP
4 | Freq: Three times a day (TID) | ORAL | Status: DC | PRN
Start: 2024-12-11 — End: 2024-12-12

## 2024-12-11 MED ORDER — PANTOPRAZOLE SODIUM 40 MG IV SOLR
40 | Freq: Every day | INTRAVENOUS | Status: DC
Start: 2024-12-11 — End: 2024-12-12
  Administered 2024-12-11 – 2024-12-12 (×2): 40 mg via INTRAVENOUS

## 2024-12-11 MED ORDER — KETOROLAC TROMETHAMINE 30 MG/ML IJ SOLN
30 | Freq: Four times a day (QID) | INTRAMUSCULAR | Status: DC
Start: 2024-12-11 — End: 2024-12-12
  Administered 2024-12-11 – 2024-12-12 (×4): 30 mg via INTRAVENOUS

## 2024-12-11 MED ORDER — NORMAL SALINE FLUSH 0.9 % IV SOLN
0.9 | Freq: Two times a day (BID) | INTRAVENOUS | Status: DC
Start: 2024-12-11 — End: 2024-12-11

## 2024-12-11 MED ORDER — ONDANSETRON HCL 4 MG/2ML IJ SOLN
4 | INTRAMUSCULAR | Status: AC
Start: 2024-12-11 — End: 2024-12-11

## 2024-12-11 MED ORDER — PROPOFOL 200 MG/20ML IV EMUL
200 | INTRAVENOUS | Status: AC
Start: 2024-12-11 — End: 2024-12-11

## 2024-12-11 MED ORDER — SCOPOLAMINE 1 MG/3DAYS TD PT72
1 | TRANSDERMAL | Status: DC
Start: 2024-12-11 — End: 2024-12-12
  Administered 2024-12-11: 13:00:00 1 via TRANSDERMAL

## 2024-12-11 MED ORDER — MIDAZOLAM HCL 2 MG/2ML IJ SOLN
2 | INTRAMUSCULAR | Status: AC
Start: 2024-12-11 — End: 2024-12-11

## 2024-12-11 MED ORDER — SUGAMMADEX SODIUM 200 MG/2ML IV SOLN
200 | INTRAVENOUS | Status: AC
Start: 2024-12-11 — End: 2024-12-11

## 2024-12-11 MED ORDER — CEFAZOLIN 2000 MG IN 20 ML SWFI IV SYRINGE (PREMIX)
Status: AC
Start: 2024-12-11 — End: 2024-12-11
  Administered 2024-12-11: 15:00:00 2000 mg via INTRAVENOUS

## 2024-12-11 MED ORDER — SODIUM CHLORIDE 0.9 % IV SOLN
0.9 | INTRAVENOUS | Status: DC | PRN
Start: 2024-12-11 — End: 2024-12-11

## 2024-12-11 MED ORDER — ACETAMINOPHEN 325 MG PO TABS
325 | Freq: Once | ORAL | Status: AC
Start: 2024-12-11 — End: 2024-12-11
  Administered 2024-12-11: 13:00:00 650 mg via ORAL

## 2024-12-11 MED ORDER — NYSTATIN 100000 UNIT/ML MT SUSP
100000 | Freq: Once | OROMUCOSAL | Status: AC
Start: 2024-12-11 — End: 2024-12-11
  Administered 2024-12-11: 13:00:00 500000 mL via ORAL

## 2024-12-11 MED ORDER — DROPERIDOL 2.5 MG/ML IJ SOLN
2.5 | Freq: Once | INTRAMUSCULAR | Status: DC | PRN
Start: 2024-12-11 — End: 2024-12-11

## 2024-12-11 MED ORDER — SUCCINYLCHOLINE CHLORIDE 100 MG/5ML IV SOSY
100 | Freq: Once | INTRAVENOUS | Status: DC | PRN
Start: 2024-12-11 — End: 2024-12-11
  Administered 2024-12-11: 14:00:00 100 via INTRAVENOUS

## 2024-12-11 MED ORDER — ONDANSETRON HCL 4 MG/2ML IJ SOLN
4 | Freq: Four times a day (QID) | INTRAMUSCULAR | Status: DC | PRN
Start: 2024-12-11 — End: 2024-12-12

## 2024-12-11 MED ORDER — SODIUM CHLORIDE 0.9 % IR SOLN
0.9 | Status: AC | PRN
Start: 2024-12-11 — End: 2024-12-11
  Administered 2024-12-11: 15:00:00 500

## 2024-12-11 MED ORDER — SUCCINYLCHOLINE CHLORIDE 100 MG/5ML IV SOSY
100 | INTRAVENOUS | Status: AC
Start: 2024-12-11 — End: 2024-12-11

## 2024-12-11 MED ORDER — MIDAZOLAM HCL 2 MG/2ML IJ SOLN
2 | Freq: Once | INTRAMUSCULAR | Status: DC | PRN
Start: 2024-12-11 — End: 2024-12-11
  Administered 2024-12-11: 14:00:00 2 via INTRAVENOUS

## 2024-12-11 MED ORDER — SODIUM CHLORIDE (PF) 0.9 % IJ SOLN
0.9 | INTRAMUSCULAR | Status: DC | PRN
Start: 2024-12-11 — End: 2024-12-11

## 2024-12-11 MED ORDER — LACTATED RINGERS IV SOLN
INTRAVENOUS | Status: DC
Start: 2024-12-11 — End: 2024-12-12
  Administered 2024-12-11 – 2024-12-12 (×2): via INTRAVENOUS

## 2024-12-11 MED ORDER — MORPHINE SULFATE (PF) 10 MG/ML IJ SOLN
10 | INTRAMUSCULAR | Status: DC | PRN
Start: 2024-12-11 — End: 2024-12-12
  Administered 2024-12-12 (×3): 6 mg via INTRAVENOUS

## 2024-12-11 MED ORDER — SODIUM CHLORIDE 0.9 % IR SOLN
0.9 | Status: AC | PRN
Start: 2024-12-11 — End: 2024-12-11
  Administered 2024-12-11: 15:00:00 1000

## 2024-12-11 MED ORDER — SUGAMMADEX SODIUM 200 MG/2ML IV SOLN
200 | Freq: Once | INTRAVENOUS | Status: DC | PRN
Start: 2024-12-11 — End: 2024-12-11
  Administered 2024-12-11: 15:00:00 200 via INTRAVENOUS

## 2024-12-11 MED ORDER — ENOXAPARIN SODIUM 40 MG/0.4ML IJ SOSY
40 | Freq: Once | INTRAMUSCULAR | Status: AC
Start: 2024-12-11 — End: 2024-12-11
  Administered 2024-12-11: 13:00:00 40 mg via SUBCUTANEOUS

## 2024-12-11 MED ORDER — PHENYLEPHRINE HCL 1 MG/10ML IV SOSY
1 | INTRAVENOUS | Status: AC
Start: 2024-12-11 — End: 2024-12-11

## 2024-12-11 MED ORDER — MEPERIDINE HCL 50 MG/ML IJ SOLN
50 | INTRAMUSCULAR | Status: DC | PRN
Start: 2024-12-11 — End: 2024-12-11

## 2024-12-11 MED ORDER — GLYCOPYRROLATE 0.4 MG/2ML IJ SOLN
0.4 | Freq: Once | INTRAMUSCULAR | Status: DC | PRN
Start: 2024-12-11 — End: 2024-12-11
  Administered 2024-12-11: 14:00:00 .2 via INTRAVENOUS

## 2024-12-11 MED ORDER — ROCURONIUM BROMIDE 50 MG/5ML IV SOLN
50 | INTRAVENOUS | Status: AC
Start: 2024-12-11 — End: 2024-12-11

## 2024-12-11 MED ORDER — IPRATROPIUM-ALBUTEROL 0.5-2.5 (3) MG/3ML IN SOLN
0.5-2.5 | Freq: Once | RESPIRATORY_TRACT | Status: DC | PRN
Start: 2024-12-11 — End: 2024-12-11

## 2024-12-11 MED ORDER — LIDOCAINE HCL (PF) 2 % IJ SOLN
2 | Freq: Once | INTRAMUSCULAR | Status: DC | PRN
Start: 2024-12-11 — End: 2024-12-11
  Administered 2024-12-11: 14:00:00 100 via INTRAVENOUS

## 2024-12-11 MED ORDER — PROPOFOL 200 MG/20ML IV EMUL
200 | Freq: Once | INTRAVENOUS | Status: DC | PRN
Start: 2024-12-11 — End: 2024-12-11
  Administered 2024-12-11: 14:00:00 200 via INTRAVENOUS

## 2024-12-11 MED ORDER — FENTANYL CITRATE (PF) 100 MCG/2ML IJ SOLN
100 | INTRAMUSCULAR | Status: AC | PRN
Start: 2024-12-11 — End: 2024-12-11
  Administered 2024-12-11 (×4): 25 ug via INTRAVENOUS

## 2024-12-11 MED ORDER — PHENYLEPHRINE HCL 1 MG/10ML IV SOSY
1 | Freq: Once | INTRAVENOUS | Status: DC | PRN
Start: 2024-12-11 — End: 2024-12-11
  Administered 2024-12-11: 15:00:00 100 via INTRAVENOUS

## 2024-12-11 MED ORDER — METOCLOPRAMIDE HCL 5 MG/ML IJ SOLN
5 | Freq: Four times a day (QID) | INTRAMUSCULAR | Status: DC
Start: 2024-12-11 — End: 2024-12-12
  Administered 2024-12-11 – 2024-12-12 (×4): 10 mg via INTRAVENOUS

## 2024-12-11 MED ORDER — KETOROLAC TROMETHAMINE 30 MG/ML IJ SOLN
30 | INTRAMUSCULAR | Status: AC
Start: 2024-12-11 — End: 2024-12-11

## 2024-12-11 MED ORDER — SODIUM CHLORIDE (PF) 0.9 % IJ SOLN
0.9 | Freq: Once | INTRAMUSCULAR | Status: AC
Start: 2024-12-11 — End: 2024-12-11
  Administered 2024-12-11: 13:00:00 20 mg via INTRAVENOUS

## 2024-12-11 MED ORDER — NORMAL SALINE FLUSH 0.9 % IV SOLN
0.9 | INTRAVENOUS | Status: DC | PRN
Start: 2024-12-11 — End: 2024-12-12

## 2024-12-11 MED ORDER — APREPITANT 40 MG PO CAPS
40 | Freq: Once | ORAL | Status: AC
Start: 2024-12-11 — End: 2024-12-11
  Administered 2024-12-11: 13:00:00 40 mg via ORAL

## 2024-12-11 MED ORDER — METOCLOPRAMIDE HCL 5 MG/ML IJ SOLN
5 | Freq: Once | INTRAMUSCULAR | Status: DC | PRN
Start: 2024-12-11 — End: 2024-12-11
  Administered 2024-12-11: 15:00:00 10 via INTRAVENOUS

## 2024-12-11 MED ORDER — LACTATED RINGERS IV SOLN
INTRAVENOUS | Status: DC
Start: 2024-12-11 — End: 2024-12-11
  Administered 2024-12-11 (×2): via INTRAVENOUS

## 2024-12-11 MED ORDER — NORMAL SALINE FLUSH 0.9 % IV SOLN
0.9 | INTRAVENOUS | Status: DC | PRN
Start: 2024-12-11 — End: 2024-12-11

## 2024-12-11 MED ORDER — SODIUM CHLORIDE 0.9 % IV SOLN
0.9 | INTRAVENOUS | Status: DC | PRN
Start: 2024-12-11 — End: 2024-12-12

## 2024-12-11 MED ORDER — ROCURONIUM BROMIDE 50 MG/5ML IV SOLN
50 | Freq: Once | INTRAVENOUS | Status: DC | PRN
Start: 2024-12-11 — End: 2024-12-11
  Administered 2024-12-11: 15:00:00 45 via INTRAVENOUS
  Administered 2024-12-11: 14:00:00 5 via INTRAVENOUS

## 2024-12-11 MED ORDER — DIPHENHYDRAMINE HCL 50 MG/ML IJ SOLN
50 | Freq: Once | INTRAMUSCULAR | Status: AC | PRN
Start: 2024-12-11 — End: 2024-12-11
  Administered 2024-12-11: 16:00:00 12.5 mg via INTRAVENOUS

## 2024-12-11 MED ORDER — HYDROMORPHONE HCL 2 MG/ML IJ SOLN
2 | INTRAMUSCULAR | Status: AC
Start: 2024-12-11 — End: 2024-12-11

## 2024-12-11 MED ORDER — KETOROLAC TROMETHAMINE 30 MG/ML IJ SOLN
30 | Freq: Once | INTRAMUSCULAR | Status: DC | PRN
Start: 2024-12-11 — End: 2024-12-11
  Administered 2024-12-11: 15:00:00 15 via INTRAVENOUS

## 2024-12-11 MED ORDER — HYDROMORPHONE HCL 2 MG/ML IJ SOLN
2 | INTRAMUSCULAR | Status: AC | PRN
Start: 2024-12-11 — End: 2024-12-11
  Administered 2024-12-11 (×4): 0.5 mg via INTRAVENOUS

## 2024-12-11 MED ORDER — OXYCODONE HCL 5 MG PO TABS
5 | Freq: Once | ORAL | Status: DC | PRN
Start: 2024-12-11 — End: 2024-12-11

## 2024-12-11 MED ORDER — LACTATED RINGERS IV SOLN
INTRAVENOUS | Status: DC
Start: 2024-12-11 — End: 2024-12-11
  Administered 2024-12-11: 13:00:00 via INTRAVENOUS

## 2024-12-11 MED ORDER — SIMETHICONE 80 MG PO CHEW
80 | Freq: Four times a day (QID) | ORAL | Status: DC | PRN
Start: 2024-12-11 — End: 2024-12-12
  Administered 2024-12-12 (×2): 80 mg via ORAL

## 2024-12-11 MED ORDER — METOCLOPRAMIDE HCL 5 MG/ML IJ SOLN
5 | INTRAMUSCULAR | Status: AC
Start: 2024-12-11 — End: 2024-12-11

## 2024-12-11 MED ORDER — ONDANSETRON HCL 4 MG/2ML IJ SOLN
4 | Freq: Once | INTRAMUSCULAR | Status: DC | PRN
Start: 2024-12-11 — End: 2024-12-11
  Administered 2024-12-11: 15:00:00 4 via INTRAVENOUS

## 2024-12-11 MED ORDER — LABETALOL HCL 5 MG/ML IV SOLN
5 | INTRAVENOUS | Status: DC | PRN
Start: 2024-12-11 — End: 2024-12-11

## 2024-12-11 MED ORDER — LIDOCAINE HCL (PF) 2 % IJ SOLN
2 | INTRAMUSCULAR | Status: AC
Start: 2024-12-11 — End: 2024-12-11

## 2024-12-11 MED FILL — HYDROMORPHONE HCL 2 MG/ML IJ SOLN: 2 mg/mL | INTRAMUSCULAR | Qty: 1

## 2024-12-11 MED FILL — CEFAZOLIN 2000 MG IN 20 ML SWFI IV SYRINGE (PREMIX): Qty: 2000

## 2024-12-11 MED FILL — BD POSIFLUSH 0.9 % IV SOLN: 0.9 % | INTRAVENOUS | Qty: 40

## 2024-12-11 MED FILL — XYLOCAINE-MPF 2 % IJ SOLN: 2 % | INTRAMUSCULAR | Qty: 5

## 2024-12-11 MED FILL — BRIDION 200 MG/2ML IV SOLN: 200 MG/2ML | INTRAVENOUS | Qty: 2

## 2024-12-11 MED FILL — SUCCINYLCHOLINE CHLORIDE 100 MG/5ML IV SOSY: 100 MG/5ML | INTRAVENOUS | Qty: 5

## 2024-12-11 MED FILL — METOCLOPRAMIDE HCL 5 MG/ML IJ SOLN: 5 mg/mL | INTRAMUSCULAR | Qty: 2

## 2024-12-11 MED FILL — GLYCOPYRROLATE 0.4 MG/2ML IJ SOLN: 0.4 MG/2ML | INTRAMUSCULAR | Qty: 2

## 2024-12-11 MED FILL — SENSORCAINE-MPF/EPINEPHRINE 0.25% -1:200000 IJ SOLN: INTRAMUSCULAR | Qty: 30

## 2024-12-11 MED FILL — ACETAMINOPHEN 325 MG PO TABS: 325 mg | ORAL | Qty: 2

## 2024-12-11 MED FILL — FAMOTIDINE (PF) 20 MG/2ML IV SOLN: 20 MG/2ML | INTRAVENOUS | Qty: 2

## 2024-12-11 MED FILL — NYSTATIN 100000 UNIT/ML MT SUSP: 100000 [IU]/mL | OROMUCOSAL | Qty: 5

## 2024-12-11 MED FILL — FENTANYL CITRATE (PF) 100 MCG/2ML IJ SOLN: 100 MCG/2ML | INTRAMUSCULAR | Qty: 2

## 2024-12-11 MED FILL — PROTONIX 40 MG IV SOLR: 40 mg | INTRAVENOUS | Qty: 40

## 2024-12-11 MED FILL — ENOXAPARIN SODIUM 40 MG/0.4ML IJ SOSY: 40 MG/0.4ML | INTRAMUSCULAR | Qty: 0.4

## 2024-12-11 MED FILL — PHENYLEPHRINE HCL (PRESSORS) 1 MG/10ML IV SOSY: 1 MG/0ML | INTRAVENOUS | Qty: 10

## 2024-12-11 MED FILL — PROPOFOL 200 MG/20ML IV EMUL: 200 MG/20ML | INTRAVENOUS | Qty: 20

## 2024-12-11 MED FILL — APREPITANT 40 MG PO CAPS: 40 mg | ORAL | Qty: 1

## 2024-12-11 MED FILL — KETOROLAC TROMETHAMINE 30 MG/ML IJ SOLN: 30 mg/mL | INTRAMUSCULAR | Qty: 1

## 2024-12-11 MED FILL — ONDANSETRON HCL 4 MG/2ML IJ SOLN: 4 MG/2ML | INTRAMUSCULAR | Qty: 2

## 2024-12-11 MED FILL — LACTATED RINGERS IV SOLN: INTRAVENOUS | Qty: 1000

## 2024-12-11 MED FILL — MIDAZOLAM HCL 2 MG/2ML IJ SOLN: 2 mg/mL | INTRAMUSCULAR | Qty: 2

## 2024-12-11 MED FILL — DIPHENHYDRAMINE HCL 50 MG/ML IJ SOLN: 50 mg/mL | INTRAMUSCULAR | Qty: 1

## 2024-12-11 MED FILL — SCOPOLAMINE 1 MG/3DAYS TD PT72: 1 MG/3DAYS | TRANSDERMAL | Qty: 1

## 2024-12-11 MED FILL — ROCURONIUM BROMIDE 50 MG/5ML IV SOLN: 50 MG/5ML | INTRAVENOUS | Qty: 5

## 2024-12-11 NOTE — Progress Notes (Signed)
 Patient arrived to the floor from PACU, alert and oriented to self, place, time. Patient is drowsy but easily arousable. Complains of itching and nausea. Per handoff and med list, patient just had anhistamine and antinausea, too early to be given. Addressed itching by assisting patient to have self wiped down. Has 5 trochar sites on the abdomen that are clean, dry, and intact, Jp drain is empty upon assessment. Patient informed to be NPO until further orders. VS obtained and charted. SCD compression implemented, educated on incentive spirometer, unable to demonstrate yet due to feeling nauseas. Will initiate again when feeling better. Also educated for ambulation, patient agreeable in the afternoon when not drowsy.Patient and family are at the bedside informed for expected care.

## 2024-12-11 NOTE — Periop Note (Signed)
"  Urine pregnancy results negative and verified with Randall HAMS RN.      "

## 2024-12-11 NOTE — Periop Note (Signed)
 TRANSFER - IN REPORT:    Verbal report received from Josh RN and [employer] on Monique Rogers  being received from OR for routine post-op      Report consisted of patient's Situation, Background, Assessment and   Recommendations(SBAR).     Information from the following report(s) Nurse Handoff Report, Adult Overview, Surgery Report, Intake/Output, MAR, and Recent Results was reviewed with the receiving nurse.    Opportunity for questions and clarification was provided.      Assessment completed upon patient's arrival to unit and care assumed.

## 2024-12-11 NOTE — Periop Note (Addendum)
 Kyla to review record. Room assignment is [id number]

## 2024-12-11 NOTE — Interval H&P Note (Signed)
 Update History & Physical    The patient's History and Physical of December 03, 2024 was reviewed with the patient and I examined the patient. There was no change. The surgical site was confirmed by the patient and me.     Plan: The risks, benefits, expected outcome, and alternative to the recommended procedure have been discussed with the patient. Patient understands and wants to proceed with the procedure.     Electronically signed by Curtistine JONETTA Bass, MD on 12/11/2024 at 7:04 AM

## 2024-12-11 NOTE — Anesthesia Pre Procedure (Signed)
 Department of Anesthesiology  Preprocedure Note       Name:  Monique Rogers   Age:  39 y.o.  DOB:  30-May-1986                                          MRN:  [id number]         Date:  12/11/2024      Surgeon: Clotilde):  Monique Monique BIRCH, MD    Procedure: LAPAROSCOPIC SLEEVE GASTRECTOMY WITH POSSIBLE LIVER WEDGE BIOPSY AND INTRAOPERATIVE ENDOSCOPY (Abdomen)    Medications prior to admission:   Prior to Admission medications   Medication Sig Start Date End Date Taking? Authorizing Provider   Cholecalciferol (VITAMIN D3) 50 MCG (2000 UT) CAPS Take 1 capsule by mouth daily   Yes [provider]   enoxaparin  (LOVENOX ) 40 MG/0.4ML Inject 0.4 milliliters subcutaneously every 12 hours  Patient not taking: Reported on 12/11/2024 12/04/24   Monique Monique BIRCH, MD   ondansetron  (ZOFRAN -ODT) 8 MG TBDP disintegrating tablet Take 1 tablet by mouth every 8 hours as needed for Nausea or Vomiting  Patient not taking: Reported on 12/11/2024 12/04/24   Monique Monique BIRCH, MD   Bacillus Coagulans-Inulin (PROBIOTIC) 1-250 BILLION-MG CAPS Take by mouth    [provider]   Multiple Vitamins-Minerals (THERAPEUTIC MULTIVITAMIN-MINERALS) tablet Take 1 tablet by mouth daily    [provider]       Current medications:    Current Facility-Administered Medications   Medication Dose Route Frequency Provider Last Rate Last Admin    sodium chloride  flush 0.9 % injection 5-40 mL  5-40 mL IntraVENous 2 times per day Monique Waylan RAMAN, PA        sodium chloride  flush 0.9 % injection 5-40 mL  5-40 mL IntraVENous PRN Monique Waylan RAMAN, PA        0.9 % sodium chloride  infusion   IntraVENous PRN Monique Waylan RAMAN, PA        lactated ringers  infusion   IntraVENous Continuous Monique Rogers, Monique Rogers 125 mL/hr at 12/11/24 0742 New Bag at 12/11/24 0742    ceFAZolin  (ANCEF ) 2000 mg in sterile water  20 mL IV syringe  2,000 mg IntraVENous On Call to OR Monique Waylan RAMAN, PA        scopolamine  (TRANSDERM-SCOP) transdermal patch 1 patch  1  patch TransDERmal Q72H Monique Waylan RAMAN, PA   1 patch at 12/11/24 0741       Allergies:    Allergies   Allergen Reactions    Latex Rash    Penicillins Rash       Problem List:    Patient Active Problem List   Diagnosis Code    Chronic low back pain M54.50, G89.29    Morbid obesity (HCC) E66.01    Morbid obesity with BMI of 45.0-49.9, adult (HCC) E66.01, Z68.42    Asthma J45.909    Arthritis M19.90    Functional dyspepsia K30    Smoking history Z87.891    Body mass index 45.0-49.9, adult (HCC) Z68.42       Past Medical History:        Diagnosis Date    Asthma     since childhood, Monique Rogers    Exercise tolerance finding     able to go up and down stairs without SOB or chest pain as of 12/03/24    Functional dyspepsia  avoids trigger foods    History of blood transfusion 2012    two units of PRBCs after cesarean section    Morbid obesity (HCC)     Monique Rogers    Morbid obesity with BMI of 45.0-49.9, adult (HCC)     Monique Rogers    OA (osteoarthritis)     at least since 2007, back and knees, last seen Monique Rogers 09/03/24 care everywhere    Smoking history     quit after very passive use    Wears contact lenses     Wears glasses        Past Surgical History:        Procedure Laterality Date    BACK SURGERY  2007    hardware in back    BREAST REDUCTION SURGERY  2017    CESAREAN SECTION  2012    X 1       Social History:    Social History     Tobacco Use    Smoking status: Former     Current packs/day: 0.00     Types: Cigarettes     Quit date: 11/15/2010     Years since quitting: 14.0    Smokeless tobacco: Never   Substance Use Topics    Alcohol use: Yes     Comment: once every three to four months                                Counseling given: Not Answered      Vital Signs (Current):   Vitals:    12/03/24 1502 12/11/24 0714   BP:  131/65   Pulse:  72   Resp:  16   Temp:  98.3 F (36.8 C)   TempSrc:  Temporal   SpO2:  100%   Weight: 118.8 kg (262 lb) 117.5 kg (259  lb)   Height: 1.575 m (5' 2) 1.549 m (5' 1)                                              BP Readings from Last 3 Encounters:   12/11/24 131/65   12/03/24 123/61   11/28/24 (!) 143/78       NPO Status: Time of last liquid consumption:  (clears > 2h)                        Time of last solid consumption:  (solids >8h)                        Date of last liquid consumption: 12/10/24                        Date of last solid food consumption: 12/09/24    BMI:   Wt Readings from Last 3 Encounters:   12/11/24 117.5 kg (259 lb)   12/03/24 119.1 kg (262 lb 9.6 oz)   11/28/24 117.8 kg (259 lb 12.8 oz)     Body mass index is 48.94 kg/m.    CBC:   Lab Results   Component Value Date/Time    WBC 5.9 11/29/2024 12:00 AM    RBC 4.92 11/29/2024 12:00 AM    HGB 12.4 11/29/2024 12:00 AM  HCT 39.3 11/29/2024 12:00 AM    MCV 80 11/29/2024 12:00 AM    RDW 14.4 11/29/2024 12:00 AM    PLT 261 11/29/2024 12:00 AM       CMP:   Lab Results   Component Value Date/Time    NA 142 11/29/2024 12:00 AM    K 3.9 11/29/2024 12:00 AM    CL 101 11/29/2024 12:00 AM    CO2 24 11/29/2024 12:00 AM    BUN 16 11/29/2024 12:00 AM    CREATININE 1.04 11/29/2024 12:00 AM    GFRAA >60.0 02/19/2021 02:44 PM    AGRATIO 1.6 10/03/2023 11:40 AM    LABGLOM 71 11/29/2024 12:00 AM    GLUCOSE 105 11/29/2024 12:00 AM    CALCIUM 9.7 11/29/2024 12:00 AM    BILITOT 0.3 11/29/2024 12:00 AM    ALKPHOS 77 11/29/2024 12:00 AM    AST 20 11/29/2024 12:00 AM    ALT 24 11/29/2024 12:00 AM       POC Tests: No results for input(s): POCGLU, POCNA, POCK, POCCL, POCBUN, POCHEMO, POCHCT in the last 72 hours.    Coags: No results found for: PROTIME, INR, APTT    HCG (If Applicable):   Lab Results   Component Value Date    PREGTESTUR Negative 12/11/2024    PREGSERUM Negative 11/29/2024        ABGs: No results found for: PHART, PO2ART, PCO2ART, HCO3ART, BEART, O2SATART     Type & Screen (If Applicable):  No results found for: LABABO    Drug/Infectious  Status (If Applicable):  No results found for: HIV, HEPCAB    COVID-19 Screening (If Applicable): No results found for: COVID19      Anesthesia Evaluation    Patient summary reviewed and Nursing notes reviewed      Airway:  Mallampati: II  TM distance: >3 FB   Neck ROM: full    Mouth opening: > = 3 FB   Dental:  normal exam         Pulmonary:   normal exam     (+)       asthma:                             ROS comment: H/o childhood asthma   Cardiovascular:     Exercise tolerance: no interval change                  Neuro/Psych:    Negative Neuro/Psych ROS             GI/Hepatic/Renal:    (+)           obesity: morbid        Endo/Other:  Negative Endo/Other ROS                    Abdominal:              Vascular:         Other Findings:            Anesthesia Plan      general     ASA 3       Induction: intravenous.      Anesthetic plan and risks discussed with patient.      Plan discussed with CRNA.                12/11/2024

## 2024-12-11 NOTE — Periop Note (Signed)
"  Anesthesia paged for sign out.  "

## 2024-12-11 NOTE — Periop Note (Signed)
 "  Family updated.   "

## 2024-12-11 NOTE — Plan of Care (Signed)
 Problem: Pain  Goal: Verbalizes/displays adequate comfort level or baseline comfort level  12/11/2024 2010 by Gattis Mering, RN  Outcome: Progressing     Problem: Skin/Tissue Integrity  Goal: Skin integrity remains intact  Description: 1.  Monitor for areas of redness and/or skin breakdown  2.  Assess vascular access sites hourly  3.  Every 4-6 hours minimum:  Change oxygen saturation probe site  4.  Every 4-6 hours:  If on nasal continuous positive airway pressure, respiratory therapy assess nares and determine need for appliance change or resting period  12/11/2024 2010 by Gattis Mering, RN  Outcome: Progressing     Problem: Safety - Adult  Goal: Free from fall injury  Outcome: Progressing

## 2024-12-11 NOTE — Nurse Navigator (Signed)
 Patient's family at bedside.  Patient alert  throughout visit.  Patient complaining of expected pain with mild nausea.      BP 126/70   Pulse 68   Temp 97 F (36.1 C) (Temporal)   Resp 18   Ht 1.549 m (5' 1)   Wt 117.5 kg (259 lb)   LMP 11/16/2024 (Exact Date)   SpO2 97%   BMI 48.94 kg/m     General: Alert & oriented to person, place, time, and situation  Abdomen: Appropriate tenderness, soft, non-distended  Lap sites: Within normal limits  JP Drain: Scant amount of serosanguinous drainage in tubing  SCD's: In place and operating within normal limits    Plan:  -Continue medications for symptom management  -Encourage ambulation  -SCD use when in bed  -IS 10 times an hour  -NPO  -UGI in AM    Plan was discussed with the patient, as well as IS teaching provided.  Patient verbalized understanding to plan and education.  Patient completed IS x3 during this visit with no issues nor concerns noted by this RN.  She reached 1,500 mL.

## 2024-12-11 NOTE — Op Note (Signed)
 OPERATIVE REPORT         Patient:Monique Rogers   DOB: 1986-03-31  Medical Record Number:[id number]    Pre-operative Diagnosis:  Morbid obesity (HCC) [E66.01]  Body mass index 45.0-49.9, adult (HCC) [Z68.42]  Post-operative Diagnosis: * No post-op diagnosis entered *  Procedure: Procedure(s):  LAPAROSCOPIC SLEEVE GASTRECTOMY  LIVER WEDGE BIOPSY  INTRAOPERATIVE ENDOSCOPY WITH BIOPSY  Location: [facility]  Surgeon: Curtistine JONETTA Bass, MD  Assistant:  Waylan Breen [facility]  - (PA Breen assisted since there were no qualified interns, residents, or any other qualified house   physicians available to assist with this surgery) he performed retraction of various structures,  assisted in   creation of the gastric sleeve, and obtained hemostasis along staple lines and skin and fascial closure      Anesthesia: General       Specimens: 1. Gastric Sleeve Resection                       2. Liver Wedge Biopsy                       3. Endoscopy biopsy    EBL: less than 5cc  Additional Findings: None  Complications: None  Implants: none             STATEMENT OF MEDICAL NECESSITY: The patient is a 39 y.o.-year-old female who has   had a history of obesity. They have failed conservative weight loss measures and    began to consider weight loss surgical options. They chose the   sleeve gastrectomy as a means of surgical weight control. They have undergone   nutritional and psychological teaching at this time period and does wish to proceed   with sleeve gastrectomy.     OPERATIVE PROCEDURE: The patient was brought to the operating room, placed   on the table in supine position at which time general  anesthesia was administered   without any difficulty. The abdomen was then prepped and draped in the   usual sterile fashion. Using a 15 blade, a 1 cm incision was made just to the   left of the umbilicus. The veress needle approach was used to gain access to   the peritoneal cavity which was then insufflated. The Visi-Port was then  placed   at that site,then 4 additional trocars were placed in the usual U-shaped   configuration with a subxiphoid incision being made to accommodate the   [facility] retractor.  On entering the abdomen, the patient was noted to have a   moderate fatty liver with possible evidence of early steatohepatitis. I elevated the liver and noted the   patient had no diaphragmatic hernia present. I began the operation by choosing an area 2-3 cm   proximal to the pylorus and within the gastroepiploic vessels I began to divide off these vessels individually.   I moved cephalad toward short gastric vessels, which were very difficult to take down due to the proximity   to the splenic hilum. I was able to do so, clearing the entire left crural area. I then placed a 36 french Visigi tube,   impacting at the distal antrum. I then began the resection with the powered Echelon   stapler using the green loads  for the first firing tangential along the   antral region. The second and subsequent firings were used with blue  reloads  reaching just past the incisura region. The remainder of the  5 vertical   firings completed the resection at the left crural region. I then left the operative field   and proceeded to the the head of the bed and performed an intraoperative EGD.  The scope was passed   successfully into the gastric sleeve to the level of the pylorus.   Biopsies were taken of this region and submitted to test both for H Pylori and for pathologic diagnosis.    There was no bleeding noted and no leak appreciated with air insufflation.  I then returned to the surgical field.     All areas were hemostatic. I then used 2 separate 2-0 Ethibond sutures to secure the lateral   aspect of the newly created sleeve stomach to the resected edge of the gastrocolic omentum in an effort   to maintain the continuity of the sleeve and prevent twisting. I then used Eviseal   along the entire staple line to obtain hemostasis and then place Kn[place]nit  over the staple line also.   With all this having been completed, I then removed the liver retractor. I performed a liver wedge biopsy of   the left lobe of the liver due to its abnormality and submitted to pathology for permanent section. I then placed   a JP drain in the left upper quadrant region along the staple line. I removed the specimen from the operative   field via the LLQ incision. I closed the periumbilical and right lower quadrant trocar site using a transabdominal #0 PDS   suture along the fascia, and all skin incisions were then closed using 4-0 subcuticular Monocryl. Steri-Strips   and sterile dressings were applied. The patient tolerated the procedure well.       [name] M.D.

## 2024-12-11 NOTE — Periop Note (Signed)
 TRANSFER - OUT REPORT:    Verbal report given to [facility] RN on Daren Doswell  being transferred to [facility] for routine post-op       Report consisted of patient's Situation, Background, Assessment and   Recommendations(SBAR).     Information from the following report(s) Nurse Handoff Report, Adult Overview, Surgery Report, Intake/Output, MAR, and Recent Results was reviewed with the receiving nurse.           Lines:   Peripheral IV 12/11/24 Left;Proximal;Ventral Forearm (Active)   Site Assessment Clean, dry & intact 12/11/24 1131   Line Status Infusing 12/11/24 1131   Phlebitis Assessment No symptoms 12/11/24 1131   Infiltration Assessment 0 12/11/24 1131   Alcohol Cap Used No 12/11/24 0755   Dressing Status Clean, dry & intact 12/11/24 1131   Dressing Type Transparent 12/11/24 1131   Dressing Intervention New 12/11/24 0755        Opportunity for questions and clarification was provided.      Patient transported with:  Registered Nurse

## 2024-12-11 NOTE — Periop Note (Signed)
"  Pt up to bathroom to void with assistance. Returned to stretcher with call light in reach.    "

## 2024-12-11 NOTE — Periop Note (Signed)
"  Reviewed PTA medication list with patient/caregiver and patient/caregiver denies any additional medications.     Patient admits to having a responsible adult care for them at home for at least 24 hours after surgery.    Patient encouraged to use gown warming system and informed that using said warming gown to regulate body temperature prior to a procedure has been shown to help reduce the risks of blood clots and infection.    Patient's pharmacy of choice verified and documented in PTA medication section.    Dual skin assessment & fall risk band verification completed with Faith A RN.    "

## 2024-12-11 NOTE — Plan of Care (Signed)
"    Problem: Pain  Goal: Verbalizes/displays adequate comfort level or baseline comfort level  Outcome: Progressing     Problem: Skin/Tissue Integrity  Goal: Skin integrity remains intact  Description: 1.  Monitor for areas of redness and/or skin breakdown  2.  Assess vascular access sites hourly  3.  Every 4-6 hours minimum:  Change oxygen saturation probe site  4.  Every 4-6 hours:  If on nasal continuous positive airway pressure, respiratory therapy assess nares and determine need for appliance change or resting period  Outcome: Progressing     "

## 2024-12-11 NOTE — Anesthesia Postprocedure Evaluation (Signed)
 Post-Anesthesia Evaluation and Assessment    Cardiovascular Function/Vital Signs  Visit Vitals  BP (!) 141/71   Pulse (!) 101   Temp 98.2 F (36.8 C) (Temporal)   Resp 12   Ht 1.549 m (5' 1)   Wt 117.5 kg (259 lb)   SpO2 96%   BMI 48.94 kg/m       Patient is status post Procedure(s):  LAPAROSCOPIC SLEEVE GASTRECTOMY WITH POSSIBLE LIVER WEDGE BIOPSY AND INTRAOPERATIVE ENDOSCOPY.    Nausea/Vomiting: Controlled.    Postoperative hydration reviewed and adequate.    Pain:      Managed.    Neurological Status:       At baseline.    Mental Status and Level of Consciousness: Progressing to baseline appropriately     Pulmonary Status:       Adequate oxygenation and airway patent.    Complications related to anesthesia: None    Post-anesthesia assessment completed. No concerns.        Signed By: MABEL LANDER, MD    December 11, 2024

## 2024-12-12 ENCOUNTER — Observation Stay: Admit: 2024-12-12 | Payer: PRIVATE HEALTH INSURANCE | Primary: Medical

## 2024-12-12 MED ORDER — OXYCODONE-ACETAMINOPHEN 5-325 MG PO TABS
5-325 | ORAL | Status: DC | PRN
Start: 2024-12-12 — End: 2024-12-12
  Administered 2024-12-12: 15:00:00 1 via ORAL

## 2024-12-12 MED ORDER — IOHEXOL 240 MG/ML IJ SOLN
240 | Freq: Once | INTRAMUSCULAR | Status: AC | PRN
Start: 2024-12-12 — End: 2024-12-12
  Administered 2024-12-12: 12:00:00 20 mL via ORAL

## 2024-12-12 MED ORDER — BARIUM SULFATE 96 % PO SUSR
96 | Freq: Once | ORAL | Status: AC | PRN
Start: 2024-12-12 — End: 2024-12-12
  Administered 2024-12-12: 12:00:00 20 mL via ORAL

## 2024-12-12 MED FILL — KETOROLAC TROMETHAMINE 30 MG/ML IJ SOLN: 30 mg/mL | INTRAMUSCULAR | Qty: 1

## 2024-12-12 MED FILL — MORPHINE SULFATE 10 MG/ML IV SOLN: 10 mg/mL | INTRAVENOUS | Qty: 1

## 2024-12-12 MED FILL — OMNIPAQUE 240 MG/ML IJ SOLN: 240 mg/mL | INTRAMUSCULAR | Qty: 20

## 2024-12-12 MED FILL — PROTONIX 40 MG IV SOLR: 40 mg | INTRAVENOUS | Qty: 40

## 2024-12-12 MED FILL — ENOXAPARIN SODIUM 40 MG/0.4ML IJ SOSY: 40 MG/0.4ML | INTRAMUSCULAR | Qty: 0.4

## 2024-12-12 MED FILL — SIMETHICONE 80 MG PO CHEW: 80 mg | ORAL | Qty: 1

## 2024-12-12 MED FILL — METOCLOPRAMIDE HCL 5 MG/ML IJ SOLN: 5 mg/mL | INTRAMUSCULAR | Qty: 2

## 2024-12-12 MED FILL — OXYCODONE-ACETAMINOPHEN 5-325 MG PO TABS: 5-325 mg | ORAL | Qty: 1

## 2024-12-12 MED FILL — E-Z-PAQUE 96 % PO SUSR: 96 % | ORAL | Qty: 20

## 2024-12-12 NOTE — Discharge Instructions (Signed)
"  [facility]  Curtistine BIRCH. Ima, M.D., F.A.C.S.  [address]  [address] [place]   [address]  Office: [phone]    Fax:  [phone]    Discharge Instruction for Gastric Bypass / Sleeve Gastrectomy Patients    Diet:  Continue with the liquid diet until you are seen in the office.  Make sure you sip fluids all day.  Aim for 90-100 Gms of protein every day.    Activity:  Walking is encouraged.  Rest when you are tired.  You may shower.  You may climb stairs.  Take your time.  No lifting more than 10-15 lbs.  If you feel discomfort during an activity, rest.  Do not drive for 1 week.      Wound Care:  Clean incisions with soap and water  when in the shower. Pat dry.  Leave steri-strips on until they fall off.  A small amount of drainage may be present from the incisions.  Contact the office if you notice an increase in drainage, an odor, increased redness, or fever > 100.5.    Medications:  You will receive a prescription for pain medication at the time of discharge.  For upset stomach you may take over the counter medications such as Maalox, Mylanta, Pepcid , Zantac, or Tagamet.  You may take Tylenol   Non-aspirin based arthritis medications may be resumed after the first month.  Take a childrens or adult chewable multivitamin.  Milk of Magnesia will help with constipation.  It is fine to take your usual home medications.  Blood pressure medications should be continued after surgery.  Diabetic medications can frequently be reduced very quickly after surgery.  Diabetics should continue to monitor blood sugars frequently after surgery and contact the prescribing physician for any questions.  Follow-Up Appointment:  If you do not already have a follow-up appointment scheduled, contact the office in the next few days to obtain one.  It is usually scheduled for 10-14 days after you surgery date.  Office phone number:  [phone].        REV  07/2009  "

## 2024-12-12 NOTE — Nurse Navigator (Signed)
 Patient sitting on side of bed sipping protein drink and tolerating well.      Vitals:   Blood pressure 139/76, pulse 96, temperature 97.7 F (36.5 C), temperature source Tympanic, resp. rate 18, height 1.549 m (5' 1), weight 117.5 kg (259 lb), last menstrual period 11/16/2024, SpO2 98%.  Output: reviewed, and patient verified urinating clear, yellow urine.  Abdomen: Lap sites without erythema, swelling,  and/or drainage.  JP drain removed.    SCD's: Positioned and operating WNL    Patient with expected pain, but is being managed and is currently tolerable.  No nausea and/or vomiting.  Patient has been ambulating the halls.  Patient is able to swallow pills.    Post-op diet progression discussed with patient.  Patient to be discharged on a bariatric full liquid diet.  Patient verbalized understanding of liquid diet for next two weeks until first post-op visit.  Goal of four ounces per hour with one ounce being protein was clearly understood. Medications were discussed (i.e., pain- Percocet , Tylenol , not to use aspirin or ibuprofen based products, as well as steroids).  Constipation was discussed and ways to alleviate it were discussed.  Education completed on IS use and to ambulate every hour when at home. Patient completed a return demonstration on IS with no issues or concerns from this RN.  Lovenox  and Percocet  filled and in the home.  Lovenox  and Percocet  education provided, and patient verbalized understanding.   Patient has chewable multi-vitamin, probiotic, and Prilosec in the home; they were reviewed.  Ketosis was also reviewed.  Patient given a report card to record intake and a handout to support bedside education.  All questions were answered by this RN, and patient verbalized understanding to all education provided.  Goals for discharge were discussed, and patient verbalized understanding.  Post-op follow-up appt. aready scheduled.

## 2024-12-12 NOTE — Progress Notes (Signed)
"    Transition of Care (TOC) Plan:          Pt admitted for an elective surgical procedure.  Pt is independent.  Please encourage ambulation.  No transition of care needs identified at this time.  No DME needs at this time. Anticipate pt will be medically stable for discharge within the next 24-48 hours with physician follow up.  Patient's husband and family will provide support during recovery. CM available to assist as needed.        TOC Transportation:   How is patient being transported at discharge? Husband      When? Once cleared by physician     Is transport scheduled? N/A      Follow-up appointment and transportation:   PCP/Specialist?  See AVS for Appointment         Who is transporting to the follow-up appointment? Self/Family/Friend      Is transport for follow up appointment scheduled? N/A    Communication plan (with patient/family):    Who is being called?  Patient or Next of Kin?  Responsible party?     Patient      What number(s) is to be used?  See Facesheet      What service provider is calling for [facility] services?              When are they calling?      Readmission Risk?  (Green/Low; Yellow/Moderate; Red/High):  [employer] here to Artist of the Environmental Health Practitioner Relationship (ie Primary)   "

## 2024-12-12 NOTE — Progress Notes (Signed)
 Bariatric Surgery                POD #1    BP 139/76   Pulse 96   Temp 97.7 F (36.5 C) (Tympanic)   Resp 19   Ht 1.549 m (5' 1)   Wt 117.5 kg (259 lb)   LMP 11/16/2024 (Exact Date)   SpO2 98%   BMI 48.94 kg/m   Patient has minimal complaints of pain, minimal nausea noted     Exam:  Appears well in no distress  Lungs- clear bilaterally  Abd - soft, incisions look good without erythema           JP with minimal serosanguinous output  Extremities- no new edema or swelling    UGI - no obstrustion or leak    Data Review:    Labs: Results:       Chemistry No results for input(s): GLU, NA, K, CL, CO2, BUN, TP, GLOB in the last 72 hours.    Invalid input(s): CREA, CA, AGAP, BUCR, TBIL, GPT, AP, ALB, AGRAT   CBC w/Diff No results for input(s): WBC, RBC, HGB, HCT, PLT, RETIC in the last 72 hours.    Invalid input(s): GRANS, LYMPH, EOS   Coagulation No results for input(s): INR, APTT in the last 72 hours.    Invalid input(s): PTP    Liver Enzymes No results for input(s): TP in the last 72 hours.    Invalid input(s): ALB, TBIL, AP, SGOT, GPT, DBIL       Assessment/Plan: S/P   laparoscopic sleeve gastrectomy - doing well without any issues    1.Start bariatric diet and protein shakes  2.D/C IV pain meds and start PO pain meds  3.D/C JP drain  4.Likley PM D/C if  Cont ok and tolerate PO

## 2024-12-12 NOTE — Plan of Care (Signed)
"    Problem: Pain  Goal: Verbalizes/displays adequate comfort level or baseline comfort level  Outcome: Progressing     Problem: Skin/Tissue Integrity  Goal: Skin integrity remains intact  Description: 1.  Monitor for areas of redness and/or skin breakdown  2.  Assess vascular access sites hourly  3.  Every 4-6 hours minimum:  Change oxygen saturation probe site  4.  Every 4-6 hours:  If on nasal continuous positive airway pressure, respiratory therapy assess nares and determine need for appliance change or resting period  Outcome: Progressing     Problem: Safety - Adult  Goal: Free from fall injury  Outcome: Progressing     "

## 2024-12-12 NOTE — Discharge Summary (Signed)
 Discharge Summary    Patient: Monique Rogers               Sex: female          DOA: 12/11/2024         Date of Birth:  1985-11-28      Age:  39 y.o.        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 The patient tolerated the procedure well. Vital signs remained stable and the patient was transferred to  3rd floor surgical unit without complications. The patient remained stable throughout the first night post operatively with stable vital signs and adequate urine output and pain control. Pain was controlled with Dilaudid  IV and IV Tylenol  .  The patient on the first morning post operative was transferred to the radiology suite where they underwent a gastrograffin UGI study which showed no evidence of a leak or stricture. The drain was discontinued on POD # 1 and the patient was started on a bariatric liquid diet with protein shakes. The patient progressed throughout the day and was ambulating well and tolerating their diet. They were therefore discharged home with instructions to notify me with any issues that may arise.    Significant Diagnostic Studies:   No results for input(s): HGB in the last 72 hours.  No results for input(s): HCT in the last 72 hours.       Medication List        CONTINUE taking these medications      Probiotic 1-250 BILLION-MG Caps     therapeutic multivitamin-minerals tablet            STOP taking these medications      vitamin D 50 MCG (2000 UT) Caps capsule  Commonly known as: CHOLECALCIFEROL            ASK your doctor about these medications      enoxaparin  40 MG/0.4ML  Commonly known as: LOVENOX   Inject 0.4  milliliters subcutaneously every 12 hours     ondansetron  8 MG Tbdp disintegrating tablet  Commonly known as: ZOFRAN -ODT  Take 1 tablet by mouth every 8 hours as needed for Nausea or Vomiting              Activity: activity as tolerated with no heavy lifting of greater than 20 pounds.     No anti- inflammatory medications. Use stool softeners at home as needed while taking pain medications since they are constipating.    Diet: Bariatric liquid diet    Wound Care: Keep wound clean and dry, Reinforce dressing PRN and ice to area for comfort. Do not get wound wet for 2 days.    Follow-up: 14 days with Dr Ima Curtistine Ima CHRISTELLA.D

## 2024-12-12 NOTE — Procedures (Signed)
 [facility]    Upper GI Procedure Report      Monique Rogers    Medical Record [id number]    08/09/1986    Date of Service - December 12, 2024    Pre-Op Diagnosis - patient is status post sleeve resection performed by myself 24 hours ago. They now present for UGI to assess their post surgical anatomy.    Post-Op Diagnosis -same    Procedure - UGI study with gastrografin    Surgeon - [name] MD    Assistant - None    Complications - None    Specimens - None    Implants - None    Estimate Blood Loss - None    Statement of Medical Necessity - Need for radiologic evaluation prior to further management of their care..    Procedure -the patient was brought to the fluoroscopy suite where they were given gastrografin.  On swallowing the contrast the patient was noted to have normal peristalsis of their esophagus with progressive flow into the distal esophagus.  Specific findings of the distal esophagus revealed that they did not have a hiatal hernia. Contrast flowed normally through the esophagus and into a properly sized sleeve stomach without reflux or obstruction or signs of stricture. The stomach filled in a timely manner and emptied into the duodenum without issue or hesitation. The anatomy was normal for the timeframe with no stricture or obstruction or any other abnormality.  Given the benign findings of today's exam we will proceed with liquid diet and start PO medications.    [name] MD

## 2024-12-12 NOTE — Care Coordination (Signed)
 12/12/24 1034   IMM Letter   Observation Status Letter date given: 12/12/24   Observation Status Letter time given: 0842   Observation Status Letter given to Patient/Family/Significant other/Guardian/POA/by: Florene Sprang     Patient has received, reviewed and signed the Patients Guide to [facility] ([facility]). Patient acknowledged understanding of the observation status. Copy left at patient's bedside and original placed in chart.

## 2024-12-12 NOTE — Periop Note (Signed)
"  Chart review for audit purposes    "

## 2024-12-13 NOTE — Care Coordination (Signed)
 Care Transitions Note    Initial Call - Call within 2 business days of discharge: Yes, left message - first attempt.    Attempted to reach patient for transitions of care follow up. Unable to reach patient.    Outreach Attempts:   HIPAA compliant voicemail left for patient.     Patient: Monique Rogers    Patient DOB: 10-26-1986   MRN: [id number]    Reason for Admission: obesity  Discharge Date: 12/12/24  RURS: Readmission Risk Score: 2.1    Last Discharge Facility       Date Complaint Diagnosis Description Type Department Provider    12/11/24   Admission (Discharged) [id number] Ima Curtistine BIRCH, MD          Pt was admitted to [facility] 12/11/24 for scheduled procedure. Admission dates: 12/11/24 - 12/12/24    Procedure: Procedure(s):  LAPAROSCOPIC SLEEVE GASTRECTOMY  LIVER WEDGE BIOPSY  INTRAOPERATIVE ENDOSCOPY WITH BIOPSY    Was this an external facility discharge? No    Follow Up Appointment:   Patient has hospital follow up appointment scheduled within 14 days of discharge.      Future Appointments         Provider Specialty Dept Phone    12/25/2024 10:30 AM Spann, Burnard KIDD, APRN - NP General Surgery [phone]    12/25/2024 11:00 AM (Arrive by 10:45 AM) CONFERENCE ROOM 2 Bariatrics     01/22/2025 2:00 PM Dawne Burnard KIDD, APRN - NP General Surgery [phone]    03/18/2025 1:30 PM Spann, Burnard KIDD, APRN - NP General Surgery [phone]            Plan for follow-up on next business day.      Madelin Moles, RN, AAS Associate Care Manager  [facility]   [address] [place]  [address]  Cell [phone] Fax [phone]

## 2024-12-13 NOTE — Telephone Encounter (Signed)
 This RN spoke with patient post-operatively.     Hydration: Patient hydrated with 32 ounces yesterday.  Thus far today, she has had 34 ounces.    Nausea and/or vomitting: Some nausea; Zofran  effective    Pain: Currently managed with Percocet  and/or Tylenol     Lovenox  injections: Administering every 12 hours, rotating sites.  RN reminded patient to complete all injections, in which patient verbalized understanding.     Lap sites: No erythema, drainage, and/or swelling    JP drain site: No drainage; dressing changed    BM: None to date, but is passing flatus.     Ambulation: Patient is walking throughout house every hour.    IS: Patient continues to use 10x's per hour while awake.  She has reached 2,500 mL.    Temperature: 98.1 degrees    Pulse: 67 bpm    Confirmed whether patient is currently taking:   *ProCare: yes   *Probiotic: yes   *B1: yes   *Prilosec: yes    Questions: None    This RN reminded the patient to contact the office with any questions and/or concerns.  RN also reminded patient they will receive another follow-up TC prior to the two week post-op follow-up appointment.  Patient verbalized understanding to both.  Patient's two week post-op visit is scheduled and was confirmed.

## 2024-12-14 NOTE — Care Coordination (Signed)
 Care Transitions Note    Initial Call - Call within 2 business days of discharge: Yes, left message - second attempt.    Attempted to reach patient for transitions of care follow up. Unable to reach patient.    Outreach Attempts:   HIPAA compliant voicemail left for patient.   Mychart message sent.     Patient: Monique Rogers    Patient DOB: 1985/12/07   MRN: [id number]    Reason for Admission: obesity  Discharge Date: 12/12/24  RURS: Readmission Risk Score: 2.1    Last Discharge Facility       Date Complaint Diagnosis Description Type Department Provider    12/11/24   Admission (Discharged) [id number] Ima Curtistine BIRCH, MD          Pt was admitted to [facility] 12/11/24 for scheduled procedure. Admission dates: 12/11/24 - 12/12/24     Procedure: Procedure(s):  LAPAROSCOPIC SLEEVE GASTRECTOMY  LIVER WEDGE BIOPSY  INTRAOPERATIVE ENDOSCOPY WITH BIOPSY    Was this an external facility discharge? No    Follow Up Appointment:   Patient has hospital follow up appointment scheduled within 14 days of discharge.    Future Appointments         Provider Specialty Dept Phone    12/25/2024 10:30 AM Spann, Burnard KIDD, APRN - NP General Surgery [phone]    12/25/2024 11:00 AM (Arrive by 10:45 AM) CONFERENCE ROOM 2 Bariatrics     01/22/2025 2:00 PM Dawne Burnard KIDD, APRN - NP General Surgery [phone]    03/18/2025 1:30 PM Spann, Burnard KIDD, APRN - NP General Surgery [phone]            Plan for follow-up call in 6-10 days     Madelin Moles, RN, AAS Associate Care Manager  [facility]   [address] [place]  [address]  Cell [phone] Fax [phone]

## 2024-12-20 NOTE — Telephone Encounter (Signed)
 Patient went to work last night and struggled with hydration.  She had 30 ounces yesterday, 60 ounces on 12/17/24 and 12/18/24.  She requested a return to work date of 01/08/25 so as to adjust to her new lifestyle.  RN completed in MyChart.

## 2024-12-25 ENCOUNTER — Ambulatory Visit: Payer: PRIVATE HEALTH INSURANCE | Attending: Family | Primary: Medical

## 2024-12-25 ENCOUNTER — Encounter: Payer: PRIVATE HEALTH INSURANCE | Primary: Medical
